# Patient Record
Sex: Female | Born: 1985 | Race: White | Hispanic: No | Marital: Married | Smoking: Never smoker
Health system: Southern US, Community
[De-identification: ages and names within clinical notes are randomized; demographics above are authoritative.]

## PROBLEM LIST (undated history)

## (undated) ENCOUNTER — Inpatient Hospital Stay (HOSPITAL_COMMUNITY): Payer: Self-pay

## (undated) DIAGNOSIS — Z789 Other specified health status: Secondary | ICD-10-CM

## (undated) DIAGNOSIS — Z3491 Encounter for supervision of normal pregnancy, unspecified, first trimester: Principal | ICD-10-CM

## (undated) DIAGNOSIS — F419 Anxiety disorder, unspecified: Secondary | ICD-10-CM

## (undated) DIAGNOSIS — Z3490 Encounter for supervision of normal pregnancy, unspecified, unspecified trimester: Secondary | ICD-10-CM

## (undated) DIAGNOSIS — F32A Depression, unspecified: Secondary | ICD-10-CM

## (undated) HISTORY — DX: Encounter for supervision of normal pregnancy, unspecified, unspecified trimester: Z34.90

## (undated) HISTORY — DX: Anxiety disorder, unspecified: F41.9

## (undated) HISTORY — DX: Encounter for supervision of normal pregnancy, unspecified, first trimester: Z34.91

## (undated) HISTORY — PX: CHOLECYSTECTOMY: SHX55

## (undated) HISTORY — DX: Depression, unspecified: F32.A

---
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
## 2009-03-20 ENCOUNTER — Emergency Department (HOSPITAL_COMMUNITY): Admission: EM | Admit: 2009-03-20 | Discharge: 2009-03-20 | Payer: Self-pay | Admitting: Emergency Medicine

## 2009-05-02 ENCOUNTER — Ambulatory Visit (HOSPITAL_COMMUNITY): Admission: RE | Admit: 2009-05-02 | Discharge: 2009-05-02 | Payer: Self-pay | Admitting: Pulmonary Disease

## 2009-08-16 ENCOUNTER — Emergency Department (HOSPITAL_COMMUNITY): Admission: EM | Admit: 2009-08-16 | Discharge: 2009-08-16 | Payer: Self-pay | Admitting: Emergency Medicine

## 2010-08-25 ENCOUNTER — Emergency Department (HOSPITAL_COMMUNITY)
Admission: EM | Admit: 2010-08-25 | Discharge: 2010-08-26 | Payer: Self-pay | Source: Home / Self Care | Admitting: Emergency Medicine

## 2010-09-12 ENCOUNTER — Ambulatory Visit (HOSPITAL_COMMUNITY)
Admission: RE | Admit: 2010-09-12 | Discharge: 2010-09-12 | Payer: Self-pay | Source: Home / Self Care | Attending: General Surgery | Admitting: General Surgery

## 2010-11-24 LAB — SURGICAL PCR SCREEN: MRSA, PCR: NEGATIVE

## 2010-11-25 LAB — DIFFERENTIAL
Basophils Absolute: 0 10*3/uL (ref 0.0–0.1)
Basophils Relative: 0 % (ref 0–1)
Eosinophils Absolute: 0.1 10*3/uL (ref 0.0–0.7)
Eosinophils Relative: 1 % (ref 0–5)
Lymphocytes Relative: 15 % (ref 12–46)
Monocytes Absolute: 0.5 10*3/uL (ref 0.1–1.0)

## 2010-11-25 LAB — COMPREHENSIVE METABOLIC PANEL
AST: 134 U/L — ABNORMAL HIGH (ref 0–37)
Albumin: 4.3 g/dL (ref 3.5–5.2)
Alkaline Phosphatase: 98 U/L (ref 39–117)
CO2: 24 mEq/L (ref 19–32)
Chloride: 102 mEq/L (ref 96–112)
Creatinine, Ser: 0.59 mg/dL (ref 0.4–1.2)
GFR calc Af Amer: >60 mL/min (ref >60)
GFR calc non Af Amer: >60 mL/min (ref >60)
Potassium: 3.6 mEq/L (ref 3.5–5.1)
Total Bilirubin: 0.8 mg/dL (ref 0.3–1.2)

## 2010-11-25 LAB — CBC
Hemoglobin: 13.9 g/dL (ref 12.0–15.0)
MCH: 28.5 pg (ref 26.0–34.0)
MCV: 79.5 fL (ref 78.0–100.0)
Platelets: 182 10*3/uL (ref 150–400)
RBC: 4.87 MIL/uL (ref 3.87–5.11)
WBC: 11.1 10*3/uL — ABNORMAL HIGH (ref 4.0–10.5)

## 2010-11-25 LAB — LIPASE, BLOOD: Lipase: 35 U/L (ref 11–59)

## 2010-11-25 LAB — URINALYSIS, ROUTINE W REFLEX MICROSCOPIC
Bilirubin Urine: NEGATIVE
Glucose, UA: NEGATIVE mg/dL
Hgb urine dipstick: NEGATIVE
Ketones, ur: NEGATIVE mg/dL
Protein, ur: NEGATIVE mg/dL
Urobilinogen, UA: 0.2 mg/dL (ref 0.0–1.0)

## 2010-12-16 LAB — URINALYSIS, ROUTINE W REFLEX MICROSCOPIC
Leukocytes, UA: NEGATIVE
Protein, ur: NEGATIVE mg/dL
Specific Gravity, Urine: >1.03 — ABNORMAL HIGH (ref 1.005–1.030)
Urobilinogen, UA: 0.2 mg/dL (ref 0.0–1.0)

## 2010-12-16 LAB — COMPREHENSIVE METABOLIC PANEL
Albumin: 3.9 g/dL (ref 3.5–5.2)
BUN: 6 mg/dL (ref 6–23)
Calcium: 8.7 mg/dL (ref 8.4–10.5)
Chloride: 105 mEq/L (ref 96–112)
Creatinine, Ser: 0.57 mg/dL (ref 0.4–1.2)
GFR calc Af Amer: >60 mL/min (ref >60)
GFR calc non Af Amer: >60 mL/min (ref >60)
Total Bilirubin: 0.5 mg/dL (ref 0.3–1.2)

## 2010-12-16 LAB — DIFFERENTIAL
Basophils Absolute: 0 10*3/uL (ref 0.0–0.1)
Lymphocytes Relative: 46 % (ref 12–46)
Lymphs Abs: 2.4 10*3/uL (ref 0.7–4.0)
Monocytes Absolute: 0.3 10*3/uL (ref 0.1–1.0)
Neutro Abs: 2.3 10*3/uL (ref 1.7–7.7)

## 2010-12-16 LAB — CBC
HCT: 40.5 % (ref 36.0–46.0)
MCHC: 34.3 g/dL (ref 30.0–36.0)
MCV: 83.3 fL (ref 78.0–100.0)
Platelets: 157 10*3/uL (ref 150–400)
WBC: 5.3 10*3/uL (ref 4.0–10.5)

## 2010-12-16 LAB — LIPASE, BLOOD: Lipase: 28 U/L (ref 11–59)

## 2010-12-16 LAB — URINE MICROSCOPIC-ADD ON

## 2011-06-28 ENCOUNTER — Encounter: Payer: Self-pay | Admitting: *Deleted

## 2011-06-28 DIAGNOSIS — R209 Unspecified disturbances of skin sensation: Secondary | ICD-10-CM | POA: Insufficient documentation
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
## 2011-06-29 ENCOUNTER — Emergency Department (HOSPITAL_COMMUNITY)
Admission: EM | Admit: 2011-06-29 | Discharge: 2011-06-29 | Disposition: A | Discharge status: Home / Self Care | Payer: 59 | Attending: Emergency Medicine | Admitting: Emergency Medicine
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
## 2011-06-29 NOTE — ED Notes (Signed)
Pt left the er stating no needs 
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
## 2012-10-06 ENCOUNTER — Other Ambulatory Visit (HOSPITAL_COMMUNITY)
Admission: RE | Admit: 2012-10-06 | Discharge: 2012-10-06 | Disposition: A | Discharge status: Home / Self Care | Payer: 59 | Source: Ambulatory Visit | Attending: Obstetrics and Gynecology | Admitting: Obstetrics and Gynecology

## 2012-10-06 ENCOUNTER — Other Ambulatory Visit: Payer: Self-pay | Admitting: Obstetrics and Gynecology

## 2012-10-06 DIAGNOSIS — Z01419 Encounter for gynecological examination (general) (routine) without abnormal findings: Secondary | ICD-10-CM | POA: Insufficient documentation

## 2014-06-19 ENCOUNTER — Encounter: Payer: Self-pay | Admitting: Adult Health

## 2014-06-19 ENCOUNTER — Ambulatory Visit (INDEPENDENT_AMBULATORY_CARE_PROVIDER_SITE_OTHER): Payer: 59 | Admitting: Adult Health

## 2014-06-19 VITALS — BP 148/76 | Ht 61.0 in | Wt 188.5 lb

## 2014-06-19 DIAGNOSIS — Z3201 Encounter for pregnancy test, result positive: Secondary | ICD-10-CM

## 2014-06-19 DIAGNOSIS — Z3490 Encounter for supervision of normal pregnancy, unspecified, unspecified trimester: Secondary | ICD-10-CM

## 2014-06-19 HISTORY — DX: Encounter for supervision of normal pregnancy, unspecified, unspecified trimester: Z34.90

## 2014-06-19 LAB — POCT URINE PREGNANCY: PREG TEST UR: POSITIVE

## 2014-06-19 NOTE — Progress Notes (Signed)
Subjective:     Patient ID: Tiffany Bass, female   DOB: 06/03/1986, 28 y.o.   MRN: [id number]  HPI Tiffany Bass is a 28 year old white female, married in for UPT.  Review of Systems See HPI Reviewed past medical,surgical, social and family history. Reviewed medications and allergies.     Objective:   Physical Exam BP 148/76  Ht 5\' 1"  (1.549 m)  Wt 188 lb 8 oz (85.503 kg)  BMI 35.64 kg/m2  LMP 05/07/2014   +UPT about 6+1 week by LMP EDD 02/12/15. Some queasy feelings no nausea or vomiting.Eating crackers, works at [employer], no lifting over 20 lbs.  Assessment:     +UPT Pregnant     Plan:    Continue flintstones  Return in 10 days for US and new OB with me   Review handout on first trimester

## 2014-06-19 NOTE — Patient Instructions (Signed)
First Trimester of Pregnancy The first trimester of pregnancy is from week 1 until the end of week 12 (months 1 through 3). A week after a sperm fertilizes an egg, the egg will implant on the wall of the uterus. This embryo will begin to develop into a baby. Genes from you and your partner are forming the baby. The female genes determine whether the baby is a boy or a girl. At 6-8 weeks, the eyes and face are formed, and the heartbeat can be seen on ultrasound. At the end of 12 weeks, all the baby's organs are formed.  Now that you are pregnant, you will want to do everything you can to have a healthy baby. Two of the most important things are to get good prenatal care and to follow your health care provider's instructions. Prenatal care is all the medical care you receive before the baby's birth. This care will help prevent, find, and treat any problems during the pregnancy and childbirth. BODY CHANGES Your body goes through many changes during pregnancy. The changes vary from woman to woman.   You may gain or lose a couple of pounds at first.  You may feel sick to your stomach (nauseous) and throw up (vomit). If the vomiting is uncontrollable, call your health care provider.  You may tire easily.  You may develop headaches that can be relieved by medicines approved by your health care provider.  You may urinate more often. Painful urination may mean you have a bladder infection.  You may develop heartburn as a result of your pregnancy.  You may develop constipation because certain hormones are causing the muscles that push waste through your intestines to slow down.  You may develop hemorrhoids or swollen, bulging veins (varicose veins).  Your breasts may begin to grow larger and become tender. Your nipples may stick out more, and the tissue that surrounds them (areola) may become darker.  Your gums may bleed and may be sensitive to brushing and flossing.  Dark spots or blotches (chloasma,  mask of pregnancy) may develop on your face. This will likely fade after the baby is born.  Your menstrual periods will stop.  You may have a loss of appetite.  You may develop cravings for certain kinds of food.  You may have changes in your emotions from day to day, such as being excited to be pregnant or being concerned that something may go wrong with the pregnancy and baby.  You may have more vivid and strange dreams.  You may have changes in your hair. These can include thickening of your hair, rapid growth, and changes in texture. Some women also have hair loss during or after pregnancy, or hair that feels dry or thin. Your hair will most likely return to normal after your baby is born. WHAT TO EXPECT AT YOUR PRENATAL VISITS During a routine prenatal visit:  You will be weighed to make sure you and the baby are growing normally.  Your blood pressure will be taken.  Your abdomen will be measured to track your baby's growth.  The fetal heartbeat will be listened to starting around week 10 or 12 of your pregnancy.  Test results from any previous visits will be discussed. Your health care provider may ask you:  How you are feeling.  If you are feeling the baby move.  If you have had any abnormal symptoms, such as leaking fluid, bleeding, severe headaches, or abdominal cramping.  If you have any questions. Other tests   that may be performed during your first trimester include:  Blood tests to find your blood type and to check for the presence of any previous infections. They will also be used to check for low iron levels (anemia) and Rh antibodies. Later in the pregnancy, blood tests for diabetes will be done along with other tests if problems develop.  Urine tests to check for infections, diabetes, or protein in the urine.  An ultrasound to confirm the proper growth and development of the baby.  An amniocentesis to check for possible genetic problems.  Fetal screens for  spina bifida and Down syndrome.  You may need other tests to make sure you and the baby are doing well. HOME CARE INSTRUCTIONS  Medicines  Follow your health care provider's instructions regarding medicine use. Specific medicines may be either safe or unsafe to take during pregnancy.  Take your prenatal vitamins as directed.  If you develop constipation, try taking a stool softener if your health care provider approves. Diet  Eat regular, well-balanced meals. Choose a variety of foods, such as meat or vegetable-based protein, fish, milk and low-fat dairy products, vegetables, fruits, and whole grain breads and cereals. Your health care provider will help you determine the amount of weight gain that is right for you.  Avoid raw meat and uncooked cheese. These carry germs that can cause birth defects in the baby.  Eating four or five small meals rather than three large meals a day may help relieve nausea and vomiting. If you start to feel nauseous, eating a few soda crackers can be helpful. Drinking liquids between meals instead of during meals also seems to help nausea and vomiting.  If you develop constipation, eat more high-fiber foods, such as fresh vegetables or fruit and whole grains. Drink enough fluids to keep your urine clear or pale yellow. Activity and Exercise  Exercise only as directed by your health care provider. Exercising will help you:  Control your weight.  Stay in shape.  Be prepared for labor and delivery.  Experiencing pain or cramping in the lower abdomen or low back is a good sign that you should stop exercising. Check with your health care provider before continuing normal exercises.  Try to avoid standing for long periods of time. Move your legs often if you must stand in one place for a long time.  Avoid heavy lifting.  Wear low-heeled shoes, and practice good posture.  You may continue to have sex unless your health care provider directs you  otherwise. Relief of Pain or Discomfort  Wear a good support bra for breast tenderness.   Take warm sitz baths to soothe any pain or discomfort caused by hemorrhoids. Use hemorrhoid cream if your health care provider approves.   Rest with your legs elevated if you have leg cramps or low back pain.  If you develop varicose veins in your legs, wear support hose. Elevate your feet for 15 minutes, 3-4 times a day. Limit salt in your diet. Prenatal Care  Schedule your prenatal visits by the twelfth week of pregnancy. They are usually scheduled monthly at first, then more often in the last 2 months before delivery.  Write down your questions. Take them to your prenatal visits.  Keep all your prenatal visits as directed by your health care provider. Safety  Wear your seat belt at all times when driving.  Make a list of emergency phone numbers, including numbers for family, friends, the hospital, and police and fire departments. General Tips    Ask your health care provider for a referral to a local prenatal education class. Begin classes no later than at the beginning of month 6 of your pregnancy.  Ask for help if you have counseling or nutritional needs during pregnancy. Your health care provider can offer advice or refer you to specialists for help with various needs.  Do not use hot tubs, steam rooms, or saunas.  Do not douche or use tampons or scented sanitary pads.  Do not cross your legs for long periods of time.  Avoid cat litter boxes and soil used by cats. These carry germs that can cause birth defects in the baby and possibly loss of the fetus by miscarriage or stillbirth.  Avoid all smoking, herbs, alcohol, and medicines not prescribed by your health care provider. Chemicals in these affect the formation and growth of the baby.  Schedule a dentist appointment. At home, brush your teeth with a soft toothbrush and be gentle when you floss. SEEK MEDICAL CARE IF:   You have  dizziness.  You have mild pelvic cramps, pelvic pressure, or nagging pain in the abdominal area.  You have persistent nausea, vomiting, or diarrhea.  You have a bad smelling vaginal discharge.  You have pain with urination.  You notice increased swelling in your face, hands, legs, or ankles. SEEK IMMEDIATE MEDICAL CARE IF:   You have a fever.  You are leaking fluid from your vagina.  You have spotting or bleeding from your vagina.  You have severe abdominal cramping or pain.  You have rapid weight gain or loss.  You vomit blood or material that looks like coffee grounds.  You are exposed to [place] and have never had them.  You are exposed to fifth disease or chickenpox.  You develop a severe headache.  You have shortness of breath.  You have any kind of trauma, such as from a fall or a car accident. Document Released: 08/25/2001 Document Revised: 01/15/2014 Document Reviewed: 07/11/2013 [facility] Patient Information 2015 [employer] [place]. This information is not intended to replace advice given to you by your health care provider. Make sure you discuss any questions you have with your health care provider. Follow up in 10 days for US and new OB with me

## 2014-06-29 ENCOUNTER — Encounter: Payer: Self-pay | Admitting: Adult Health

## 2014-06-29 ENCOUNTER — Other Ambulatory Visit: Payer: Self-pay | Admitting: Adult Health

## 2014-06-29 ENCOUNTER — Ambulatory Visit (INDEPENDENT_AMBULATORY_CARE_PROVIDER_SITE_OTHER): Payer: 59 | Admitting: Adult Health

## 2014-06-29 ENCOUNTER — Ambulatory Visit (INDEPENDENT_AMBULATORY_CARE_PROVIDER_SITE_OTHER): Payer: 59

## 2014-06-29 VITALS — BP 140/70 | Wt 190.0 lb

## 2014-06-29 DIAGNOSIS — Z1159 Encounter for screening for other viral diseases: Secondary | ICD-10-CM

## 2014-06-29 DIAGNOSIS — Z3201 Encounter for pregnancy test, result positive: Secondary | ICD-10-CM

## 2014-06-29 DIAGNOSIS — Z1389 Encounter for screening for other disorder: Secondary | ICD-10-CM

## 2014-06-29 DIAGNOSIS — Z331 Pregnant state, incidental: Secondary | ICD-10-CM

## 2014-06-29 DIAGNOSIS — Z3491 Encounter for supervision of normal pregnancy, unspecified, first trimester: Secondary | ICD-10-CM

## 2014-06-29 DIAGNOSIS — Z1371 Encounter for nonprocreative screening for genetic disease carrier status: Secondary | ICD-10-CM

## 2014-06-29 DIAGNOSIS — O26841 Uterine size-date discrepancy, first trimester: Secondary | ICD-10-CM

## 2014-06-29 DIAGNOSIS — Z3490 Encounter for supervision of normal pregnancy, unspecified, unspecified trimester: Secondary | ICD-10-CM

## 2014-06-29 DIAGNOSIS — Z0283 Encounter for blood-alcohol and blood-drug test: Secondary | ICD-10-CM

## 2014-06-29 DIAGNOSIS — Z3481 Encounter for supervision of other normal pregnancy, first trimester: Secondary | ICD-10-CM

## 2014-06-29 DIAGNOSIS — Z113 Encounter for screening for infections with a predominantly sexual mode of transmission: Secondary | ICD-10-CM

## 2014-06-29 DIAGNOSIS — Z114 Encounter for screening for human immunodeficiency virus [HIV]: Secondary | ICD-10-CM

## 2014-06-29 DIAGNOSIS — Z3401 Encounter for supervision of normal first pregnancy, first trimester: Secondary | ICD-10-CM

## 2014-06-29 DIAGNOSIS — Z3400 Encounter for supervision of normal first pregnancy, unspecified trimester: Secondary | ICD-10-CM | POA: Insufficient documentation

## 2014-06-29 DIAGNOSIS — Z118 Encounter for screening for other infectious and parasitic diseases: Secondary | ICD-10-CM

## 2014-06-29 DIAGNOSIS — Z0184 Encounter for antibody response examination: Secondary | ICD-10-CM

## 2014-06-29 DIAGNOSIS — Z1329 Encounter for screening for other suspected endocrine disorder: Secondary | ICD-10-CM

## 2014-06-29 HISTORY — DX: Encounter for supervision of normal pregnancy, unspecified, first trimester: Z34.91

## 2014-06-29 LAB — POCT URINALYSIS DIPSTICK
Blood, UA: NEGATIVE
Glucose, UA: NEGATIVE
Ketones, UA: NEGATIVE
LEUKOCYTES UA: NEGATIVE
Nitrite, UA: NEGATIVE
PROTEIN UA: NEGATIVE

## 2014-06-29 LAB — CBC
HCT: 40.2 % (ref 36.0–46.0)
Hemoglobin: 14.2 g/dL (ref 12.0–15.0)
MCH: 28.8 pg (ref 26.0–34.0)
MCHC: 35.3 g/dL (ref 30.0–36.0)
MCV: 81.5 fL (ref 78.0–100.0)
PLATELETS: 185 10*3/uL (ref 150–400)
RBC: 4.93 MIL/uL (ref 3.87–5.11)
RDW: 14.1 % (ref 11.5–15.5)
WBC: 7.8 10*3/uL (ref 4.0–10.5)

## 2014-06-29 NOTE — Progress Notes (Signed)
U/S-transvaginal u/s performed, retroverted uterus noted with single IUP with +FCA noted FHR-118 bpm, CRL c/w 6+1wks EDD 02/21/2015, cx appears closed, bilateral adnexa appears WNL with C.L. noted

## 2014-06-29 NOTE — Patient Instructions (Signed)
First Trimester of Pregnancy The first trimester of pregnancy is from week 1 until the end of week 12 (months 1 through 3). A week after a sperm fertilizes an egg, the egg will implant on the wall of the uterus. This embryo will begin to develop into a baby. Genes from you and your partner are forming the baby. The female genes determine whether the baby is a boy or a girl. At 6-8 weeks, the eyes and face are formed, and the heartbeat can be seen on ultrasound. At the end of 12 weeks, all the baby's organs are formed.  Now that you are pregnant, you will want to do everything you can to have a healthy baby. Two of the most important things are to get good prenatal care and to follow your health care provider's instructions. Prenatal care is all the medical care you receive before the baby's birth. This care will help prevent, find, and treat any problems during the pregnancy and childbirth. BODY CHANGES Your body goes through many changes during pregnancy. The changes vary from woman to woman.   You may gain or lose a couple of pounds at first.  You may feel sick to your stomach (nauseous) and throw up (vomit). If the vomiting is uncontrollable, call your health care provider.  You may tire easily.  You may develop headaches that can be relieved by medicines approved by your health care provider.  You may urinate more often. Painful urination may mean you have a bladder infection.  You may develop heartburn as a result of your pregnancy.  You may develop constipation because certain hormones are causing the muscles that push waste through your intestines to slow down.  You may develop hemorrhoids or swollen, bulging veins (varicose veins).  Your breasts may begin to grow larger and become tender. Your nipples may stick out more, and the tissue that surrounds them (areola) may become darker.  Your gums may bleed and may be sensitive to brushing and flossing.  Dark spots or blotches (chloasma,  mask of pregnancy) may develop on your face. This will likely fade after the baby is born.  Your menstrual periods will stop.  You may have a loss of appetite.  You may develop cravings for certain kinds of food.  You may have changes in your emotions from day to day, such as being excited to be pregnant or being concerned that something may go wrong with the pregnancy and baby.  You may have more vivid and strange dreams.  You may have changes in your hair. These can include thickening of your hair, rapid growth, and changes in texture. Some women also have hair loss during or after pregnancy, or hair that feels dry or thin. Your hair will most likely return to normal after your baby is born. WHAT TO EXPECT AT YOUR PRENATAL VISITS During a routine prenatal visit:  You will be weighed to make sure you and the baby are growing normally.  Your blood pressure will be taken.  Your abdomen will be measured to track your baby's growth.  The fetal heartbeat will be listened to starting around week 10 or 12 of your pregnancy.  Test results from any previous visits will be discussed. Your health care provider may ask you:  How you are feeling.  If you are feeling the baby move.  If you have had any abnormal symptoms, such as leaking fluid, bleeding, severe headaches, or abdominal cramping.  If you have any questions. Other tests   that may be performed during your first trimester include:  Blood tests to find your blood type and to check for the presence of any previous infections. They will also be used to check for low iron levels (anemia) and Rh antibodies. Later in the pregnancy, blood tests for diabetes will be done along with other tests if problems develop.  Urine tests to check for infections, diabetes, or protein in the urine.  An ultrasound to confirm the proper growth and development of the baby.  An amniocentesis to check for possible genetic problems.  Fetal screens for  spina bifida and Down syndrome.  You may need other tests to make sure you and the baby are doing well. HOME CARE INSTRUCTIONS  Medicines  Follow your health care provider's instructions regarding medicine use. Specific medicines may be either safe or unsafe to take during pregnancy.  Take your prenatal vitamins as directed.  If you develop constipation, try taking a stool softener if your health care provider approves. Diet  Eat regular, well-balanced meals. Choose a variety of foods, such as meat or vegetable-based protein, fish, milk and low-fat dairy products, vegetables, fruits, and whole grain breads and cereals. Your health care provider will help you determine the amount of weight gain that is right for you.  Avoid raw meat and uncooked cheese. These carry germs that can cause birth defects in the baby.  Eating four or five small meals rather than three large meals a day may help relieve nausea and vomiting. If you start to feel nauseous, eating a few soda crackers can be helpful. Drinking liquids between meals instead of during meals also seems to help nausea and vomiting.  If you develop constipation, eat more high-fiber foods, such as fresh vegetables or fruit and whole grains. Drink enough fluids to keep your urine clear or pale yellow. Activity and Exercise  Exercise only as directed by your health care provider. Exercising will help you:  Control your weight.  Stay in shape.  Be prepared for labor and delivery.  Experiencing pain or cramping in the lower abdomen or low back is a good sign that you should stop exercising. Check with your health care provider before continuing normal exercises.  Try to avoid standing for long periods of time. Move your legs often if you must stand in one place for a long time.  Avoid heavy lifting.  Wear low-heeled shoes, and practice good posture.  You may continue to have sex unless your health care provider directs you  otherwise. Relief of Pain or Discomfort  Wear a good support bra for breast tenderness.   Take warm sitz baths to soothe any pain or discomfort caused by hemorrhoids. Use hemorrhoid cream if your health care provider approves.   Rest with your legs elevated if you have leg cramps or low back pain.  If you develop varicose veins in your legs, wear support hose. Elevate your feet for 15 minutes, 3-4 times a day. Limit salt in your diet. Prenatal Care  Schedule your prenatal visits by the twelfth week of pregnancy. They are usually scheduled monthly at first, then more often in the last 2 months before delivery.  Write down your questions. Take them to your prenatal visits.  Keep all your prenatal visits as directed by your health care provider. Safety  Wear your seat belt at all times when driving.  Make a list of emergency phone numbers, including numbers for family, friends, the hospital, and police and fire departments. General Tips    Ask your health care provider for a referral to a local prenatal education class. Begin classes no later than at the beginning of month 6 of your pregnancy.  Ask for help if you have counseling or nutritional needs during pregnancy. Your health care provider can offer advice or refer you to specialists for help with various needs.  Do not use hot tubs, steam rooms, or saunas.  Do not douche or use tampons or scented sanitary pads.  Do not cross your legs for long periods of time.  Avoid cat litter boxes and soil used by cats. These carry germs that can cause birth defects in the baby and possibly loss of the fetus by miscarriage or stillbirth.  Avoid all smoking, herbs, alcohol, and medicines not prescribed by your health care provider. Chemicals in these affect the formation and growth of the baby.  Schedule a dentist appointment. At home, brush your teeth with a soft toothbrush and be gentle when you floss. SEEK MEDICAL CARE IF:   You have  dizziness.  You have mild pelvic cramps, pelvic pressure, or nagging pain in the abdominal area.  You have persistent nausea, vomiting, or diarrhea.  You have a bad smelling vaginal discharge.  You have pain with urination.  You notice increased swelling in your face, hands, legs, or ankles. SEEK IMMEDIATE MEDICAL CARE IF:   You have a fever.  You are leaking fluid from your vagina.  You have spotting or bleeding from your vagina.  You have severe abdominal cramping or pain.  You have rapid weight gain or loss.  You vomit blood or material that looks like coffee grounds.  You are exposed to [place] and have never had them.  You are exposed to fifth disease or chickenpox.  You develop a severe headache.  You have shortness of breath.  You have any kind of trauma, such as from a fall or a car accident. Document Released: 08/25/2001 Document Revised: 01/15/2014 Document Reviewed: 07/11/2013 [facility] Patient Information 2015 [place], [place]. This information is not intended to replace advice given to you by your health care provider. Make sure you discuss any questions you have with your health care provider. Follow up in 4 weeks

## 2014-06-29 NOTE — Progress Notes (Signed)
Subjective:  Tiffany Bass is a 28 y.o. G1P0 Caucasian female at 5667w1d by US being seen today for her first obstetrical visit.  Her obstetrical history is significant for none other than first pregnancy.  Pregnancy history fully reviewed.  Patient reports nausea.Declines meds for now ,try eating often,can use peppermint in defusor. Denies vb, cramping, uti s/s, abnormal/malodorous vag d/c, or vulvovaginal itching/irritation.  BP 140/70  LMP 05/07/2014  HISTORY: OB History  Gravida Para Term Preterm AB SAB TAB Ectopic Multiple Living  1             # Outcome Date GA Lbr Len/2nd Weight Sex Delivery Anes PTL Lv  1 CUR              Past Medical History  Diagnosis Date  . Pregnant 06/19/2014  . Supervision of normal pregnancy in first trimester 06/29/2014     [facility]  Roxana HiresChris  Nicolls 28 yo wm 1st Dating By US Pap 10/06/12 GC/CT Initial:                36+wks: Genetic Screen NT/IT:  CF screen  Anatomic US  Flu vaccine  Tdap Recommended ~ 28wks Glucose Screen  2 hr GBS  Feed Preference  Contraception  Circumcision  Childbirth Classes  Pediatrician     Past Surgical History  Procedure Laterality Date  . Cholecystectomy     Family History  Problem Relation Age of Onset  . Diabetes Paternal Grandmother   . COPD Father   . Asthma Father   . Hypertension Mother     Exam   System:     General: Well developed & nourished, no acute distress   Skin: Warm & dry, normal coloration and turgor, no rashes   Neurologic: Alert & oriented, normal mood   Cardiovascular: Regular rate & rhythm   Respiratory: Effort & rate normal, LCTAB, acyanotic   Abdomen: Soft, non tender   Extremities: normal strength, tone                                   Thyroid normal  Pelvic Exam:    Perineum: deferred   Vulva: deferred   Vagina:  deferred   Cervix: deferred   Uterus: deferred    FHR: 118 via US   Assessment:   Pregnancy: G1P0 Patient Active Problem List   Diagnosis Date Noted   . Supervision of normal pregnancy in first trimester 06/29/2014  . Pregnant 06/19/2014    5667w1d G1P0 New OB visit     Plan:  Initial labs drawn Continue prenatal vitamins Problem list reviewed and updated Reviewed n/v relief measures and warning s/s to report Reviewed recommended weight gain based on pre-gravid BMI Encouraged well-balanced diet Genetic Screening discussed Integrated Screen: declined Cystic fibrosis screening discussed requested Ultrasound discussed; fetal survey: requested Follow up in 4 weeks for US and see me Ask about flu shot at follow up to get after first trimester.  Adline PotterJennifer A. Griffin, NP 06/29/2014 11:21 AM

## 2014-06-30 LAB — URINALYSIS, ROUTINE W REFLEX MICROSCOPIC
Bilirubin Urine: NEGATIVE
GLUCOSE, UA: NEGATIVE mg/dL
HGB URINE DIPSTICK: NEGATIVE
KETONES UR: NEGATIVE mg/dL
Leukocytes, UA: NEGATIVE
Nitrite: NEGATIVE
Protein, ur: NEGATIVE mg/dL
Specific Gravity, Urine: 1.026 (ref 1.005–1.030)
Urobilinogen, UA: 0.2 mg/dL (ref 0.0–1.0)
pH: 6 (ref 5.0–8.0)

## 2014-06-30 LAB — TSH: TSH: 2.762 u[IU]/mL (ref 0.350–4.500)

## 2014-06-30 LAB — RPR

## 2014-06-30 LAB — URINE CULTURE
Colony Count: NO GROWTH
ORGANISM ID, BACTERIA: NO GROWTH

## 2014-06-30 LAB — DRUG SCREEN, URINE, NO CONFIRMATION
Amphetamine Screen, Ur: NEGATIVE
BARBITURATE QUANT UR: NEGATIVE
Benzodiazepines.: NEGATIVE
Cocaine Metabolites: NEGATIVE
Creatinine,U: 178.3 mg/dL
MARIJUANA METABOLITE: NEGATIVE
METHADONE: NEGATIVE
OPIATE SCREEN, URINE: NEGATIVE
PROPOXYPHENE: NEGATIVE
Phencyclidine (PCP): NEGATIVE

## 2014-06-30 LAB — ANTIBODY SCREEN: Antibody Screen: NEGATIVE

## 2014-06-30 LAB — GC/CHLAMYDIA PROBE AMP
CT Probe RNA: NEGATIVE
GC Probe RNA: NEGATIVE

## 2014-06-30 LAB — ABO AND RH: Rh Type: POSITIVE

## 2014-06-30 LAB — HIV ANTIBODY (ROUTINE TESTING W REFLEX): HIV: NONREACTIVE

## 2014-06-30 LAB — RUBELLA SCREEN: Rubella: 1.26 Index — ABNORMAL HIGH (ref <0.90)

## 2014-06-30 LAB — OXYCODONE SCREEN, UA, RFLX CONFIRM: OXYCODONE SCRN UR: NEGATIVE ng/mL

## 2014-06-30 LAB — HEPATITIS B SURFACE ANTIGEN: Hepatitis B Surface Ag: NEGATIVE

## 2014-06-30 LAB — VARICELLA ZOSTER ANTIBODY, IGG: Varicella IgG: 2451 Index — ABNORMAL HIGH (ref <135.00)

## 2014-07-02 ENCOUNTER — Encounter: Payer: Self-pay | Admitting: Adult Health

## 2014-07-02 LAB — CYSTIC FIBROSIS DIAGNOSTIC STUDY

## 2014-07-03 ENCOUNTER — Encounter: Payer: Self-pay | Admitting: Adult Health

## 2014-07-16 ENCOUNTER — Encounter: Payer: Self-pay | Admitting: Adult Health

## 2014-07-27 ENCOUNTER — Ambulatory Visit (INDEPENDENT_AMBULATORY_CARE_PROVIDER_SITE_OTHER): Payer: 59 | Admitting: Adult Health

## 2014-07-27 ENCOUNTER — Encounter: Payer: Self-pay | Admitting: Adult Health

## 2014-07-27 ENCOUNTER — Ambulatory Visit (INDEPENDENT_AMBULATORY_CARE_PROVIDER_SITE_OTHER): Payer: 59

## 2014-07-27 VITALS — BP 136/70 | Wt 190.0 lb

## 2014-07-27 DIAGNOSIS — Z3481 Encounter for supervision of other normal pregnancy, first trimester: Secondary | ICD-10-CM

## 2014-07-27 DIAGNOSIS — O3680X0 Pregnancy with inconclusive fetal viability, not applicable or unspecified: Secondary | ICD-10-CM

## 2014-07-27 DIAGNOSIS — Z1389 Encounter for screening for other disorder: Secondary | ICD-10-CM

## 2014-07-27 DIAGNOSIS — Z3491 Encounter for supervision of normal pregnancy, unspecified, first trimester: Secondary | ICD-10-CM

## 2014-07-27 DIAGNOSIS — Z331 Pregnant state, incidental: Secondary | ICD-10-CM

## 2014-07-27 LAB — POCT URINALYSIS DIPSTICK
Glucose, UA: NEGATIVE
Ketones, UA: NEGATIVE
Leukocytes, UA: NEGATIVE
Nitrite, UA: NEGATIVE
Protein, UA: NEGATIVE

## 2014-07-27 NOTE — Progress Notes (Signed)
G1P0 7831w1d Estimated Date of Delivery: 02/21/15  Blood pressure 136/70, weight 190 lb (86.183 kg), last menstrual period 05/07/2014.   BP weight and urine results all reviewed and noted.  Please refer to the obstetrical flow sheet for the fundal height and fetal heart rate documentation:FHR 175 US  Patient denies any bleeding and no rupture of membranes symptoms or regular contractions. Patient is without complaints.Nausea much better. All questions were answered.  Plan:  Continued routine obstetrical care,  Follow up in 4 weeks for OB appointment, with Selena BattenKim

## 2014-07-27 NOTE — Progress Notes (Signed)
U/S(10+1wks)-active fetus CRL c/w dates, fluid WNL, fundal Gr 0 placenta, cx appears closed, FHr- 175 bpm, bilateral adnexa appears WNL

## 2014-07-27 NOTE — Patient Instructions (Signed)
Second Trimester of Pregnancy The second trimester is from week 13 through week 28, months 4 through 6. The second trimester is often a time when you feel your best. Your body has also adjusted to being pregnant, and you begin to feel better physically. Usually, morning sickness has lessened or quit completely, you may have more energy, and you may have an increase in appetite. The second trimester is also a time when the fetus is growing rapidly. At the end of the sixth month, the fetus is about 9 inches long and weighs about 1 pounds. You will likely begin to feel the baby move (quickening) between 18 and 20 weeks of the pregnancy. BODY CHANGES Your body goes through many changes during pregnancy. The changes vary from woman to woman.   Your weight will continue to increase. You will notice your lower abdomen bulging out.  You may begin to get stretch marks on your hips, abdomen, and breasts.  You may develop headaches that can be relieved by medicines approved by your health care provider.  You may urinate more often because the fetus is pressing on your bladder.  You may develop or continue to have heartburn as a result of your pregnancy.  You may develop constipation because certain hormones are causing the muscles that push waste through your intestines to slow down.  You may develop hemorrhoids or swollen, bulging veins (varicose veins).  You may have back pain because of the weight gain and pregnancy hormones relaxing your joints between the bones in your pelvis and as a result of a shift in weight and the muscles that support your balance.  Your breasts will continue to grow and be tender.  Your gums may bleed and may be sensitive to brushing and flossing.  Dark spots or blotches (chloasma, mask of pregnancy) may develop on your face. This will likely fade after the baby is born.  A dark line from your belly button to the pubic area (linea nigra) may appear. This will likely fade  after the baby is born.  You may have changes in your hair. These can include thickening of your hair, rapid growth, and changes in texture. Some women also have hair loss during or after pregnancy, or hair that feels dry or thin. Your hair will most likely return to normal after your baby is born. WHAT TO EXPECT AT YOUR PRENATAL VISITS During a routine prenatal visit:  You will be weighed to make sure you and the fetus are growing normally.  Your blood pressure will be taken.  Your abdomen will be measured to track your baby's growth.  The fetal heartbeat will be listened to.  Any test results from the previous visit will be discussed. Your health care provider may ask you:  How you are feeling.  If you are feeling the baby move.  If you have had any abnormal symptoms, such as leaking fluid, bleeding, severe headaches, or abdominal cramping.  If you have any questions. Other tests that may be performed during your second trimester include:  Blood tests that check for:  Low iron levels (anemia).  Gestational diabetes (between 24 and 28 weeks).  Rh antibodies.  Urine tests to check for infections, diabetes, or protein in the urine.  An ultrasound to confirm the proper growth and development of the baby.  An amniocentesis to check for possible genetic problems.  Fetal screens for spina bifida and Down syndrome. HOME CARE INSTRUCTIONS   Avoid all smoking, herbs, alcohol, and unprescribed   drugs. These chemicals affect the formation and growth of the baby.  Follow your health care provider's instructions regarding medicine use. There are medicines that are either safe or unsafe to take during pregnancy.  Exercise only as directed by your health care provider. Experiencing uterine cramps is a good sign to stop exercising.  Continue to eat regular, healthy meals.  Wear a good support bra for breast tenderness.  Do not use hot tubs, steam rooms, or saunas.  Wear your  seat belt at all times when driving.  Avoid raw meat, uncooked cheese, cat litter boxes, and soil used by cats. These carry germs that can cause birth defects in the baby.  Take your prenatal vitamins.  Try taking a stool softener (if your health care provider approves) if you develop constipation. Eat more high-fiber foods, such as fresh vegetables or fruit and whole grains. Drink plenty of fluids to keep your urine clear or pale yellow.  Take warm sitz baths to soothe any pain or discomfort caused by hemorrhoids. Use hemorrhoid cream if your health care provider approves.  If you develop varicose veins, wear support hose. Elevate your feet for 15 minutes, 3-4 times a day. Limit salt in your diet.  Avoid heavy lifting, wear low heel shoes, and practice good posture.  Rest with your legs elevated if you have leg cramps or low back pain.  Visit your dentist if you have not gone yet during your pregnancy. Use a soft toothbrush to brush your teeth and be gentle when you floss.  A sexual relationship may be continued unless your health care provider directs you otherwise.  Continue to go to all your prenatal visits as directed by your health care provider. SEEK MEDICAL CARE IF:   You have dizziness.  You have mild pelvic cramps, pelvic pressure, or nagging pain in the abdominal area.  You have persistent nausea, vomiting, or diarrhea.  You have a bad smelling vaginal discharge.  You have pain with urination. SEEK IMMEDIATE MEDICAL CARE IF:   You have a fever.  You are leaking fluid from your vagina.  You have spotting or bleeding from your vagina.  You have severe abdominal cramping or pain.  You have rapid weight gain or loss.  You have shortness of breath with chest pain.  You notice sudden or extreme swelling of your face, hands, ankles, feet, or legs.  You have not felt your baby move in over an hour.  You have severe headaches that do not go away with  medicine.  You have vision changes. Document Released: 08/25/2001 Document Revised: 09/05/2013 Document Reviewed: 11/01/2012 [employer] Patient Information 2015 [employer]. This information is not intended to replace advice given to you by your health care provider. Make sure you discuss any questions you have with your health care provider. Follow up in 4 weeks 

## 2014-08-27 ENCOUNTER — Encounter: Payer: Self-pay | Admitting: Women's Health

## 2014-08-27 ENCOUNTER — Ambulatory Visit (INDEPENDENT_AMBULATORY_CARE_PROVIDER_SITE_OTHER): Payer: 59 | Admitting: Women's Health

## 2014-08-27 VITALS — BP 130/78 | Wt 188.0 lb

## 2014-08-27 DIAGNOSIS — Z1389 Encounter for screening for other disorder: Secondary | ICD-10-CM

## 2014-08-27 DIAGNOSIS — Z23 Encounter for immunization: Secondary | ICD-10-CM

## 2014-08-27 DIAGNOSIS — Z3402 Encounter for supervision of normal first pregnancy, second trimester: Secondary | ICD-10-CM

## 2014-08-27 DIAGNOSIS — Z331 Pregnant state, incidental: Secondary | ICD-10-CM

## 2014-08-27 LAB — POCT URINALYSIS DIPSTICK
Blood, UA: NEGATIVE
GLUCOSE UA: NEGATIVE
Ketones, UA: NEGATIVE
Leukocytes, UA: NEGATIVE
Nitrite, UA: NEGATIVE
Protein, UA: NEGATIVE

## 2014-08-27 NOTE — Patient Instructions (Signed)
Second Trimester of Pregnancy The second trimester is from week 13 through week 28, months 4 through 6. The second trimester is often a time when you feel your best. Your body has also adjusted to being pregnant, and you begin to feel better physically. Usually, morning sickness has lessened or quit completely, you may have more energy, and you may have an increase in appetite. The second trimester is also a time when the fetus is growing rapidly. At the end of the sixth month, the fetus is about 9 inches long and weighs about 1 pounds. You will likely begin to feel the baby move (quickening) between 18 and 20 weeks of the pregnancy. BODY CHANGES Your body goes through many changes during pregnancy. The changes vary from woman to woman.   Your weight will continue to increase. You will notice your lower abdomen bulging out.  You may begin to get stretch marks on your hips, abdomen, and breasts.  You may develop headaches that can be relieved by medicines approved by your health care provider.  You may urinate more often because the fetus is pressing on your bladder.  You may develop or continue to have heartburn as a result of your pregnancy.  You may develop constipation because certain hormones are causing the muscles that push waste through your intestines to slow down.  You may develop hemorrhoids or swollen, bulging veins (varicose veins).  You may have back pain because of the weight gain and pregnancy hormones relaxing your joints between the bones in your pelvis and as a result of a shift in weight and the muscles that support your balance.  Your breasts will continue to grow and be tender.  Your gums may bleed and may be sensitive to brushing and flossing.  Dark spots or blotches (chloasma, mask of pregnancy) may develop on your face. This will likely fade after the baby is born.  A dark line from your belly button to the pubic area (linea nigra) may appear. This will likely fade  after the baby is born.  You may have changes in your hair. These can include thickening of your hair, rapid growth, and changes in texture. Some women also have hair loss during or after pregnancy, or hair that feels dry or thin. Your hair will most likely return to normal after your baby is born. WHAT TO EXPECT AT YOUR PRENATAL VISITS During a routine prenatal visit:  You will be weighed to make sure you and the fetus are growing normally.  Your blood pressure will be taken.  Your abdomen will be measured to track your baby's growth.  The fetal heartbeat will be listened to.  Any test results from the previous visit will be discussed. Your health care provider may ask you:  How you are feeling.  If you are feeling the baby move.  If you have had any abnormal symptoms, such as leaking fluid, bleeding, severe headaches, or abdominal cramping.  If you have any questions. Other tests that may be performed during your second trimester include:  Blood tests that check for:  Low iron levels (anemia).  Gestational diabetes (between 24 and 28 weeks).  Rh antibodies.  Urine tests to check for infections, diabetes, or protein in the urine.  An ultrasound to confirm the proper growth and development of the baby.  An amniocentesis to check for possible genetic problems.  Fetal screens for spina bifida and Down syndrome. HOME CARE INSTRUCTIONS   Avoid all smoking, herbs, alcohol, and unprescribed   drugs. These chemicals affect the formation and growth of the baby.  Follow your health care provider's instructions regarding medicine use. There are medicines that are either safe or unsafe to take during pregnancy.  Exercise only as directed by your health care provider. Experiencing uterine cramps is a good sign to stop exercising.  Continue to eat regular, healthy meals.  Wear a good support bra for breast tenderness.  Do not use hot tubs, steam rooms, or saunas.  Wear your  seat belt at all times when driving.  Avoid raw meat, uncooked cheese, cat litter boxes, and soil used by cats. These carry germs that can cause birth defects in the baby.  Take your prenatal vitamins.  Try taking a stool softener (if your health care provider approves) if you develop constipation. Eat more high-fiber foods, such as fresh vegetables or fruit and whole grains. Drink plenty of fluids to keep your urine clear or pale yellow.  Take warm sitz baths to soothe any pain or discomfort caused by hemorrhoids. Use hemorrhoid cream if your health care provider approves.  If you develop varicose veins, wear support hose. Elevate your feet for 15 minutes, 3-4 times a day. Limit salt in your diet.  Avoid heavy lifting, wear low heel shoes, and practice good posture.  Rest with your legs elevated if you have leg cramps or low back pain.  Visit your dentist if you have not gone yet during your pregnancy. Use a soft toothbrush to brush your teeth and be gentle when you floss.  A sexual relationship may be continued unless your health care provider directs you otherwise.  Continue to go to all your prenatal visits as directed by your health care provider. SEEK MEDICAL CARE IF:   You have dizziness.  You have mild pelvic cramps, pelvic pressure, or nagging pain in the abdominal area.  You have persistent nausea, vomiting, or diarrhea.  You have a bad smelling vaginal discharge.  You have pain with urination. SEEK IMMEDIATE MEDICAL CARE IF:   You have a fever.  You are leaking fluid from your vagina.  You have spotting or bleeding from your vagina.  You have severe abdominal cramping or pain.  You have rapid weight gain or loss.  You have shortness of breath with chest pain.  You notice sudden or extreme swelling of your face, hands, ankles, feet, or legs.  You have not felt your baby move in over an hour.  You have severe headaches that do not go away with  medicine.  You have vision changes. Document Released: 08/25/2001 Document Revised: 09/05/2013 Document Reviewed: 11/01/2012 [employer] Patient Information 2015 [employer]. This information is not intended to replace advice given to you by your health care provider. Make sure you discuss any questions you have with your health care provider.  

## 2014-08-27 NOTE — Progress Notes (Signed)
Low-risk OB appointment G1P0 5124w4d Estimated Date of Delivery: 02/21/15 BP 130/78 mmHg  Wt 188 lb (85.276 kg)  LMP 05/07/2014  BP, weight, and urine reviewed.  Refer to obstetrical flow sheet for FH & FHR.  No fm yet. Denies cramping, lof, vb, or uti s/s. No complaints. Declined genetic screeni ng.  Reviewed warning s/s to report. Plan:  Continue routine obstetrical care  F/U in 3wks for OB appointment, will wait til 20+wks for anatomy u/s

## 2014-09-14 NOTE — L&D Delivery Note (Signed)
28yo G1P0 at 7876w1d, induction for preeclampsia.  Delivery Note At 1:49 PM a viable female was delivered via  (Presentation: vertex, LOA).  APGAR: 8, 9; weight pending  .   Placenta status: delivered, intact spontaneously.  Cord: 3vc.  No complications.  Anesthesia: Epidural  Lacerations:  2nd degree Suture Repair: 3.0 vicryl rapide Est. Blood Loss (mL):  314 mL  Mom to postpartum.  Baby to Couplet care / Skin to Skin.  Leandre Wien JEHIEL 02/01/2015, 2:27 PM

## 2014-09-17 ENCOUNTER — Encounter: Payer: Self-pay | Admitting: Women's Health

## 2014-09-17 ENCOUNTER — Ambulatory Visit (INDEPENDENT_AMBULATORY_CARE_PROVIDER_SITE_OTHER): Payer: 59 | Admitting: Women's Health

## 2014-09-17 VITALS — BP 128/70 | Wt 193.0 lb

## 2014-09-17 DIAGNOSIS — Z331 Pregnant state, incidental: Secondary | ICD-10-CM

## 2014-09-17 DIAGNOSIS — Z1389 Encounter for screening for other disorder: Secondary | ICD-10-CM

## 2014-09-17 DIAGNOSIS — Z363 Encounter for antenatal screening for malformations: Secondary | ICD-10-CM

## 2014-09-17 DIAGNOSIS — Z3402 Encounter for supervision of normal first pregnancy, second trimester: Secondary | ICD-10-CM

## 2014-09-17 LAB — POCT URINALYSIS DIPSTICK
Blood, UA: NEGATIVE
GLUCOSE UA: NEGATIVE
KETONES UA: NEGATIVE
LEUKOCYTES UA: NEGATIVE
NITRITE UA: NEGATIVE
Protein, UA: NEGATIVE

## 2014-09-17 NOTE — Progress Notes (Signed)
Low-risk OB appointment G1P0 [redacted]w[redacted]d Estimated Date of Delivery: 02/21/15 BP 128/70 mmHg  Wt 193 lb (87.544 kg)  LMP 05/07/2014  BP, weight, and urine reviewed.  Refer to obstetrical flow sheet for FH & FHR.  No fm yet. Denies cramping, lof, vb, or uti s/s. Some mild occ cramping- to increase fluids. Some constipation- gave relief measures.  Reviewed ptl s/s. Plan:  Continue routine obstetrical care  F/U in 3wks for OB appointment and anatomy u/s

## 2014-09-17 NOTE — Patient Instructions (Signed)
Constipation  Drink plenty of fluid, preferably water, throughout the day  Eat foods high in fiber such as fruits, vegetables, and grains  Exercise, such as walking, is a good way to keep your bowels regular  Drink warm fluids, especially warm prune juice, or decaf coffee  Eat a 1/2 cup of real oatmeal (not instant), 1/2 cup applesauce, and 1/2-1 cup warm prune juice every day  If needed, you may take Colace (docusate sodium) stool softener once or twice a day to help keep the stool soft. If you are pregnant, wait until you are out of your first trimester (12-14 weeks of pregnancy)  If you still are having problems with constipation, you may take Miralax once daily as needed to help keep your bowels regular.  If you are pregnant, wait until you are out of your first trimester (12-14 weeks of pregnancy)    Second Trimester of Pregnancy The second trimester is from week 13 through week 28, months 4 through 6. The second trimester is often a time when you feel your best. Your body has also adjusted to being pregnant, and you begin to feel better physically. Usually, morning sickness has lessened or quit completely, you may have more energy, and you may have an increase in appetite. The second trimester is also a time when the fetus is growing rapidly. At the end of the sixth month, the fetus is about 9 inches long and weighs about 1 pounds. You will likely begin to feel the baby move (quickening) between 18 and 20 weeks of the pregnancy. BODY CHANGES Your body goes through many changes during pregnancy. The changes vary from woman to woman.  8. Your weight will continue to increase. You will notice your lower abdomen bulging out. 9. You may begin to get stretch marks on your hips, abdomen, and breasts. 10. You may develop headaches that can be relieved by medicines approved by your health care provider. 11. You may urinate more often because the fetus is pressing on your bladder. 12. You may  develop or continue to have heartburn as a result of your pregnancy. 13. You may develop constipation because certain hormones are causing the muscles that push waste through your intestines to slow down. 14. You may develop hemorrhoids or swollen, bulging veins (varicose veins). 15. You may have back pain because of the weight gain and pregnancy hormones relaxing your joints between the bones in your pelvis and as a result of a shift in weight and the muscles that support your balance. 16. Your breasts will continue to grow and be tender. 17. Your gums may bleed and may be sensitive to brushing and flossing. 18. Dark spots or blotches (chloasma, mask of pregnancy) may develop on your face. This will likely fade after the baby is born. 19. A dark line from your belly button to the pubic area (linea nigra) may appear. This will likely fade after the baby is born. 20. You may have changes in your hair. These can include thickening of your hair, rapid growth, and changes in texture. Some women also have hair loss during or after pregnancy, or hair that feels dry or thin. Your hair will most likely return to normal after your baby is born. WHAT TO EXPECT AT YOUR PRENATAL VISITS During a routine prenatal visit:  You will be weighed to make sure you and the fetus are growing normally.  Your blood pressure will be taken.  Your abdomen will be measured to track your baby's growth.  The fetal heartbeat will be listened to.  Any test results from the previous visit will be discussed. Your health care provider may ask you:  How you are feeling.  If you are feeling the baby move.  If you have had any abnormal symptoms, such as leaking fluid, bleeding, severe headaches, or abdominal cramping.  If you have any questions. Other tests that may be performed during your second trimester include:  Blood tests that check for:  Low iron levels (anemia).  Gestational diabetes (between 24 and 28  weeks).  Rh antibodies.  Urine tests to check for infections, diabetes, or protein in the urine.  An ultrasound to confirm the proper growth and development of the baby.  An amniocentesis to check for possible genetic problems.  Fetal screens for spina bifida and Down syndrome. HOME CARE INSTRUCTIONS   Avoid all smoking, herbs, alcohol, and unprescribed drugs. These chemicals affect the formation and growth of the baby.  Follow your health care provider's instructions regarding medicine use. There are medicines that are either safe or unsafe to take during pregnancy.  Exercise only as directed by your health care provider. Experiencing uterine cramps is a good sign to stop exercising.  Continue to eat regular, healthy meals.  Wear a good support bra for breast tenderness.  Do not use hot tubs, steam rooms, or saunas.  Wear your seat belt at all times when driving.  Avoid raw meat, uncooked cheese, cat litter boxes, and soil used by cats. These carry germs that can cause birth defects in the baby.  Take your prenatal vitamins.  Try taking a stool softener (if your health care provider approves) if you develop constipation. Eat more high-fiber foods, such as fresh vegetables or fruit and whole grains. Drink plenty of fluids to keep your urine clear or pale yellow.  Take warm sitz baths to soothe any pain or discomfort caused by hemorrhoids. Use hemorrhoid cream if your health care provider approves.  If you develop varicose veins, wear support hose. Elevate your feet for 15 minutes, 3-4 times a day. Limit salt in your diet.  Avoid heavy lifting, wear low heel shoes, and practice good posture.  Rest with your legs elevated if you have leg cramps or low back pain.  Visit your dentist if you have not gone yet during your pregnancy. Use a soft toothbrush to brush your teeth and be gentle when you floss.  A sexual relationship may be continued unless your health care provider  directs you otherwise.  Continue to go to all your prenatal visits as directed by your health care provider. SEEK MEDICAL CARE IF:   You have dizziness.  You have mild pelvic cramps, pelvic pressure, or nagging pain in the abdominal area.  You have persistent nausea, vomiting, or diarrhea.  You have a bad smelling vaginal discharge.  You have pain with urination. SEEK IMMEDIATE MEDICAL CARE IF:   You have a fever.  You are leaking fluid from your vagina.  You have spotting or bleeding from your vagina.  You have severe abdominal cramping or pain.  You have rapid weight gain or loss.  You have shortness of breath with chest pain.  You notice sudden or extreme swelling of your face, hands, ankles, feet, or legs.  You have not felt your baby move in over an hour.  You have severe headaches that do not go away with medicine.  You have vision changes. Document Released: 08/25/2001 Document Revised: 09/05/2013 Document Reviewed: 11/01/2012 [employer] Patient  Information 2015 [employer]. This information is not intended to replace advice given to you by your health care provider. Make sure you discuss any questions you have with your health care provider.  

## 2014-09-19 ENCOUNTER — Telehealth: Payer: Self-pay | Admitting: Women's Health

## 2014-09-19 NOTE — Telephone Encounter (Signed)
Pt states she is having sinus drainage and congestion with sore throat that gets worse at night.  Advised pt she could try taking Sudafed or Mucinex for the congestion and try warm salt water gargles, Tylenol and throat lozenges for the sore throat, also advised to push fluids and call us back if symptoms get worse or feels like she needs to be seen.  Pt verbalized understanding.

## 2014-10-08 ENCOUNTER — Ambulatory Visit (INDEPENDENT_AMBULATORY_CARE_PROVIDER_SITE_OTHER): Payer: 59

## 2014-10-08 ENCOUNTER — Encounter: Payer: Self-pay | Admitting: Women's Health

## 2014-10-08 ENCOUNTER — Ambulatory Visit (INDEPENDENT_AMBULATORY_CARE_PROVIDER_SITE_OTHER): Payer: 59 | Admitting: Women's Health

## 2014-10-08 VITALS — BP 120/64 | Wt 197.0 lb

## 2014-10-08 DIAGNOSIS — Z36 Encounter for antenatal screening of mother: Secondary | ICD-10-CM

## 2014-10-08 DIAGNOSIS — Z3402 Encounter for supervision of normal first pregnancy, second trimester: Secondary | ICD-10-CM

## 2014-10-08 DIAGNOSIS — Z1389 Encounter for screening for other disorder: Secondary | ICD-10-CM

## 2014-10-08 DIAGNOSIS — Z363 Encounter for antenatal screening for malformations: Secondary | ICD-10-CM

## 2014-10-08 DIAGNOSIS — Z331 Pregnant state, incidental: Secondary | ICD-10-CM

## 2014-10-08 LAB — POCT URINALYSIS DIPSTICK
Ketones, UA: NEGATIVE
LEUKOCYTES UA: NEGATIVE
Nitrite, UA: NEGATIVE
PROTEIN UA: NEGATIVE
RBC UA: NEGATIVE

## 2014-10-08 NOTE — Progress Notes (Signed)
Low-risk OB appointment G1P0 517w4d Estimated Date of Delivery: 02/21/15 LMP 05/07/2014  BP, weight, and urine reviewed.  Refer to obstetrical flow sheet for FH & FHR.  Reports good fm.  Denies regular uc's, lof, vb, or uti s/s. Swelling in Rt foot when up for too long, has h/o stress fx in that foot. Recommended elevating as much as possible, compression hose if needed Reviewed today's normal anatomy u/s, ptl s/s, fm. Plan:  Continue routine obstetrical care  F/U in 4wks for OB appointment

## 2014-10-08 NOTE — Patient Instructions (Signed)
Second Trimester of Pregnancy The second trimester is from week 13 through week 28, months 4 through 6. The second trimester is often a time when you feel your best. Your body has also adjusted to being pregnant, and you begin to feel better physically. Usually, morning sickness has lessened or quit completely, you may have more energy, and you may have an increase in appetite. The second trimester is also a time when the fetus is growing rapidly. At the end of the sixth month, the fetus is about 9 inches long and weighs about 1 pounds. You will likely begin to feel the baby move (quickening) between 18 and 20 weeks of the pregnancy. BODY CHANGES Your body goes through many changes during pregnancy. The changes vary from woman to woman.   Your weight will continue to increase. You will notice your lower abdomen bulging out.  You may begin to get stretch marks on your hips, abdomen, and breasts.  You may develop headaches that can be relieved by medicines approved by your health care provider.  You may urinate more often because the fetus is pressing on your bladder.  You may develop or continue to have heartburn as a result of your pregnancy.  You may develop constipation because certain hormones are causing the muscles that push waste through your intestines to slow down.  You may develop hemorrhoids or swollen, bulging veins (varicose veins).  You may have back pain because of the weight gain and pregnancy hormones relaxing your joints between the bones in your pelvis and as a result of a shift in weight and the muscles that support your balance.  Your breasts will continue to grow and be tender.  Your gums may bleed and may be sensitive to brushing and flossing.  Dark spots or blotches (chloasma, mask of pregnancy) may develop on your face. This will likely fade after the baby is born.  A dark line from your belly button to the pubic area (linea nigra) may appear. This will likely fade  after the baby is born.  You may have changes in your hair. These can include thickening of your hair, rapid growth, and changes in texture. Some women also have hair loss during or after pregnancy, or hair that feels dry or thin. Your hair will most likely return to normal after your baby is born. WHAT TO EXPECT AT YOUR PRENATAL VISITS During a routine prenatal visit:  You will be weighed to make sure you and the fetus are growing normally.  Your blood pressure will be taken.  Your abdomen will be measured to track your baby's growth.  The fetal heartbeat will be listened to.  Any test results from the previous visit will be discussed. Your health care provider may ask you:  How you are feeling.  If you are feeling the baby move.  If you have had any abnormal symptoms, such as leaking fluid, bleeding, severe headaches, or abdominal cramping.  If you have any questions. Other tests that may be performed during your second trimester include:  Blood tests that check for:  Low iron levels (anemia).  Gestational diabetes (between 24 and 28 weeks).  Rh antibodies.  Urine tests to check for infections, diabetes, or protein in the urine.  An ultrasound to confirm the proper growth and development of the baby.  An amniocentesis to check for possible genetic problems.  Fetal screens for spina bifida and Down syndrome. HOME CARE INSTRUCTIONS   Avoid all smoking, herbs, alcohol, and unprescribed   drugs. These chemicals affect the formation and growth of the baby.  Follow your health care provider's instructions regarding medicine use. There are medicines that are either safe or unsafe to take during pregnancy.  Exercise only as directed by your health care provider. Experiencing uterine cramps is a good sign to stop exercising.  Continue to eat regular, healthy meals.  Wear a good support bra for breast tenderness.  Do not use hot tubs, steam rooms, or saunas.  Wear your  seat belt at all times when driving.  Avoid raw meat, uncooked cheese, cat litter boxes, and soil used by cats. These carry germs that can cause birth defects in the baby.  Take your prenatal vitamins.  Try taking a stool softener (if your health care provider approves) if you develop constipation. Eat more high-fiber foods, such as fresh vegetables or fruit and whole grains. Drink plenty of fluids to keep your urine clear or pale yellow.  Take warm sitz baths to soothe any pain or discomfort caused by hemorrhoids. Use hemorrhoid cream if your health care provider approves.  If you develop varicose veins, wear support hose. Elevate your feet for 15 minutes, 3-4 times a day. Limit salt in your diet.  Avoid heavy lifting, wear low heel shoes, and practice good posture.  Rest with your legs elevated if you have leg cramps or low back pain.  Visit your dentist if you have not gone yet during your pregnancy. Use a soft toothbrush to brush your teeth and be gentle when you floss.  A sexual relationship may be continued unless your health care provider directs you otherwise.  Continue to go to all your prenatal visits as directed by your health care provider. SEEK MEDICAL CARE IF:   You have dizziness.  You have mild pelvic cramps, pelvic pressure, or nagging pain in the abdominal area.  You have persistent nausea, vomiting, or diarrhea.  You have a bad smelling vaginal discharge.  You have pain with urination. SEEK IMMEDIATE MEDICAL CARE IF:   You have a fever.  You are leaking fluid from your vagina.  You have spotting or bleeding from your vagina.  You have severe abdominal cramping or pain.  You have rapid weight gain or loss.  You have shortness of breath with chest pain.  You notice sudden or extreme swelling of your face, hands, ankles, feet, or legs.  You have not felt your baby move in over an hour.  You have severe headaches that do not go away with  medicine.  You have vision changes. Document Released: 08/25/2001 Document Revised: 09/05/2013 Document Reviewed: 11/01/2012 [employer] Patient Information 2015 [employer]. This information is not intended to replace advice given to you by your health care provider. Make sure you discuss any questions you have with your health care provider.  

## 2014-10-08 NOTE — Progress Notes (Signed)
U/S(20+4wks)-active fetus, meas c/w dates, fluid wnl, fundal Gr 0 placenta, cx appears closed (4.1cm), bilateral adnexa appears WNL, [id number] bpm, no obvious major abnl noted, female fetus

## 2014-11-05 ENCOUNTER — Ambulatory Visit (INDEPENDENT_AMBULATORY_CARE_PROVIDER_SITE_OTHER): Payer: 59 | Admitting: Women's Health

## 2014-11-05 ENCOUNTER — Encounter: Payer: Self-pay | Admitting: Women's Health

## 2014-11-05 VITALS — BP 138/76 | Wt 203.0 lb

## 2014-11-05 DIAGNOSIS — Z331 Pregnant state, incidental: Secondary | ICD-10-CM

## 2014-11-05 DIAGNOSIS — Z3402 Encounter for supervision of normal first pregnancy, second trimester: Secondary | ICD-10-CM

## 2014-11-05 DIAGNOSIS — Z1389 Encounter for screening for other disorder: Secondary | ICD-10-CM

## 2014-11-05 LAB — POCT URINALYSIS DIPSTICK
Blood, UA: NEGATIVE
GLUCOSE UA: NEGATIVE
Ketones, UA: NEGATIVE
Leukocytes, UA: NEGATIVE
Nitrite, UA: NEGATIVE
Protein, UA: NEGATIVE

## 2014-11-05 NOTE — Progress Notes (Signed)
Low-risk OB appointment G1P0 7611w4d Estimated Date of Delivery: 02/21/15 BP 138/76 mmHg  Wt 203 lb (92.08 kg)  LMP 05/07/2014  BP, weight, and urine reviewed.  Refer to obstetrical flow sheet for FH & FHR.  Reports good fm.  Denies regular uc's, lof, vb, or uti s/s. Hands/feet swelling, was ingesting a lot of sodium, has cut back some and swelling has improved. Denies ha, scotomata, ruq/epigastric pain, n/v.  Requests rx for breast pump- given.  Trace to +1 BLE edema today.  Reviewed ptl s/s, fm. Plan:  Continue routine obstetrical care  F/U in 4wks for OB appointment and PN2, also has blood work that her [employer] is requiring for work

## 2014-11-05 NOTE — Patient Instructions (Signed)
You will have your sugar test next visit.  Please do not eat or drink anything after midnight the night before you come, not even water.  You will be here for at least two hours.     Call the office ([phone]) or go to [facility] if:  You begin to have strong, frequent contractions  Your water breaks.  Sometimes it is a big gush of fluid, sometimes it is just a trickle that keeps getting your panties wet or running down your legs  You have vaginal bleeding.  It is normal to have a small amount of spotting if your cervix was checked.   You don't feel your baby moving like normal.  If you don't, get you something to eat and drink and lay down and focus on feeling your baby move.   If your baby is still not moving like normal, you should call the office or go to [facility].  Second Trimester of Pregnancy The second trimester is from week 13 through week 28, months 4 through 6. The second trimester is often a time when you feel your best. Your body has also adjusted to being pregnant, and you begin to feel better physically. Usually, morning sickness has lessened or quit completely, you may have more energy, and you may have an increase in appetite. The second trimester is also a time when the fetus is growing rapidly. At the end of the sixth month, the fetus is about 9 inches long and weighs about 1 pounds. You will likely begin to feel the baby move (quickening) between 18 and 20 weeks of the pregnancy. BODY CHANGES Your body goes through many changes during pregnancy. The changes vary from woman to woman.   Your weight will continue to increase. You will notice your lower abdomen bulging out.  You may begin to get stretch marks on your hips, abdomen, and breasts.  You may develop headaches that can be relieved by medicines approved by your health care provider.  You may urinate more often because the fetus is pressing on your bladder.  You may develop or continue to have  heartburn as a result of your pregnancy.  You may develop constipation because certain hormones are causing the muscles that push waste through your intestines to slow down.  You may develop hemorrhoids or swollen, bulging veins (varicose veins).  You may have back pain because of the weight gain and pregnancy hormones relaxing your joints between the bones in your pelvis and as a result of a shift in weight and the muscles that support your balance.  Your breasts will continue to grow and be tender.  Your gums may bleed and may be sensitive to brushing and flossing.  Dark spots or blotches (chloasma, mask of pregnancy) may develop on your face. This will likely fade after the baby is born.  A dark line from your belly button to the pubic area (linea nigra) may appear. This will likely fade after the baby is born.  You may have changes in your hair. These can include thickening of your hair, rapid growth, and changes in texture. Some women also have hair loss during or after pregnancy, or hair that feels dry or thin. Your hair will most likely return to normal after your baby is born. WHAT TO EXPECT AT YOUR PRENATAL VISITS During a routine prenatal visit:  You will be weighed to make sure you and the fetus are growing normally.  Your blood pressure will be taken.    Your abdomen will be measured to track your baby's growth.  The fetal heartbeat will be listened to.  Any test results from the previous visit will be discussed. Your health care provider may ask you:  How you are feeling.  If you are feeling the baby move.  If you have had any abnormal symptoms, such as leaking fluid, bleeding, severe headaches, or abdominal cramping.  If you have any questions. Other tests that may be performed during your second trimester include:  Blood tests that check for:  Low iron levels (anemia).  Gestational diabetes (between 24 and 28 weeks).  Rh antibodies.  Urine tests to check  for infections, diabetes, or protein in the urine.  An ultrasound to confirm the proper growth and development of the baby.  An amniocentesis to check for possible genetic problems.  Fetal screens for spina bifida and Down syndrome. HOME CARE INSTRUCTIONS   Avoid all smoking, herbs, alcohol, and unprescribed drugs. These chemicals affect the formation and growth of the baby.  Follow your health care provider's instructions regarding medicine use. There are medicines that are either safe or unsafe to take during pregnancy.  Exercise only as directed by your health care provider. Experiencing uterine cramps is a good sign to stop exercising.  Continue to eat regular, healthy meals.  Wear a good support bra for breast tenderness.  Do not use hot tubs, steam rooms, or saunas.  Wear your seat belt at all times when driving.  Avoid raw meat, uncooked cheese, cat litter boxes, and soil used by cats. These carry germs that can cause birth defects in the baby.  Take your prenatal vitamins.  Try taking a stool softener (if your health care provider approves) if you develop constipation. Eat more high-fiber foods, such as fresh vegetables or fruit and whole grains. Drink plenty of fluids to keep your urine clear or pale yellow.  Take warm sitz baths to soothe any pain or discomfort caused by hemorrhoids. Use hemorrhoid cream if your health care provider approves.  If you develop varicose veins, wear support hose. Elevate your feet for 15 minutes, 3-4 times a day. Limit salt in your diet.  Avoid heavy lifting, wear low heel shoes, and practice good posture.  Rest with your legs elevated if you have leg cramps or low back pain.  Visit your dentist if you have not gone yet during your pregnancy. Use a soft toothbrush to brush your teeth and be gentle when you floss.  A sexual relationship may be continued unless your health care provider directs you otherwise.  Continue to go to all your  prenatal visits as directed by your health care provider. SEEK MEDICAL CARE IF:   You have dizziness.  You have mild pelvic cramps, pelvic pressure, or nagging pain in the abdominal area.  You have persistent nausea, vomiting, or diarrhea.  You have a bad smelling vaginal discharge.  You have pain with urination. SEEK IMMEDIATE MEDICAL CARE IF:   You have a fever.  You are leaking fluid from your vagina.  You have spotting or bleeding from your vagina.  You have severe abdominal cramping or pain.  You have rapid weight gain or loss.  You have shortness of breath with chest pain.  You notice sudden or extreme swelling of your face, hands, ankles, feet, or legs.  You have not felt your baby move in over an hour.  You have severe headaches that do not go away with medicine.  You have vision changes.   Document Released: 08/25/2001 Document Revised: 09/05/2013 Document Reviewed: 11/01/2012 [employer] Patient Information 2015 [employer]. This information is not intended to replace advice given to you by your health care provider. Make sure you discuss any questions you have with your health care provider.     

## 2014-12-03 ENCOUNTER — Encounter: Payer: Self-pay | Admitting: Women's Health

## 2014-12-03 ENCOUNTER — Other Ambulatory Visit: Payer: 59

## 2014-12-03 ENCOUNTER — Ambulatory Visit (INDEPENDENT_AMBULATORY_CARE_PROVIDER_SITE_OTHER): Payer: 59 | Admitting: Women's Health

## 2014-12-03 VITALS — BP 120/70 | HR 76 | Wt 204.0 lb

## 2014-12-03 DIAGNOSIS — Z1389 Encounter for screening for other disorder: Secondary | ICD-10-CM

## 2014-12-03 DIAGNOSIS — Z3403 Encounter for supervision of normal first pregnancy, third trimester: Secondary | ICD-10-CM

## 2014-12-03 DIAGNOSIS — Z0000 Encounter for general adult medical examination without abnormal findings: Secondary | ICD-10-CM

## 2014-12-03 DIAGNOSIS — Z113 Encounter for screening for infections with a predominantly sexual mode of transmission: Secondary | ICD-10-CM

## 2014-12-03 DIAGNOSIS — Z131 Encounter for screening for diabetes mellitus: Secondary | ICD-10-CM

## 2014-12-03 DIAGNOSIS — Z0184 Encounter for antibody response examination: Secondary | ICD-10-CM

## 2014-12-03 DIAGNOSIS — Z114 Encounter for screening for human immunodeficiency virus [HIV]: Secondary | ICD-10-CM

## 2014-12-03 DIAGNOSIS — Z331 Pregnant state, incidental: Secondary | ICD-10-CM

## 2014-12-03 LAB — POCT URINALYSIS DIPSTICK
Blood, UA: NEGATIVE
Glucose, UA: NEGATIVE
Ketones, UA: NEGATIVE
Nitrite, UA: NEGATIVE
PROTEIN UA: NEGATIVE

## 2014-12-03 NOTE — Patient Instructions (Signed)
Call the office ([phone]) or go to [facility] if:  You begin to have strong, frequent contractions  Your water breaks.  Sometimes it is a big gush of fluid, sometimes it is just a trickle that keeps getting your panties wet or running down your legs  You have vaginal bleeding.  It is normal to have a small amount of spotting if your cervix was checked.   You don't feel your baby moving like normal.  If you don't, get you something to eat and drink and lay down and focus on feeling your baby move.  You should feel at least 10 movements in 2 hours.  If you don't, you should call the office or go to [facility].    Tdap Vaccine  It is recommended that you get the Tdap vaccine during the third trimester of EACH pregnancy to help protect your baby from getting pertussis (whooping cough)  27-36 weeks is the BEST time to do this so that you can pass the protection on to your baby. During pregnancy is better than after pregnancy, but if you are unable to get it during pregnancy it will be offered at the hospital.   You can get this vaccine at the health department or your family doctor  Everyone who will be around your baby should also be up-to-date on their vaccines. Adults (who are not pregnant) only need 1 dose of Tdap during adulthood.     Third Trimester of Pregnancy The third trimester is from week 29 through week 42, months 7 through 9. The third trimester is a time when the fetus is growing rapidly. At the end of the ninth month, the fetus is about 20 inches in length and weighs 6-10 pounds.  BODY CHANGES Your body goes through many changes during pregnancy. The changes vary from woman to woman.  8. Your weight will continue to increase. You can expect to gain 25-35 pounds (11-16 kg) by the end of the pregnancy. 9. You may begin to get stretch marks on your hips, abdomen, and breasts. 10. You may urinate more often because the fetus is moving lower into your pelvis and pressing  on your bladder. 11. You may develop or continue to have heartburn as a result of your pregnancy. 12. You may develop constipation because certain hormones are causing the muscles that push waste through your intestines to slow down. 13. You may develop hemorrhoids or swollen, bulging veins (varicose veins). 14. You may have pelvic pain because of the weight gain and pregnancy hormones relaxing your joints between the bones in your pelvis. Backaches may result from overexertion of the muscles supporting your posture. 15. You may have changes in your hair. These can include thickening of your hair, rapid growth, and changes in texture. Some women also have hair loss during or after pregnancy, or hair that feels dry or thin. Your hair will most likely return to normal after your baby is born. 16. Your breasts will continue to grow and be tender. A yellow discharge may leak from your breasts called colostrum. 17. Your belly button may stick out. 18. You may feel short of breath because of your expanding uterus. 19. You may notice the fetus "dropping," or moving lower in your abdomen. 20. You may have a bloody mucus discharge. This usually occurs a few days to a week before labor begins. 21. Your cervix becomes thin and soft (effaced) near your due date. WHAT TO EXPECT AT YOUR PRENATAL EXAMS  You will   have prenatal exams every 2 weeks until week 36. Then, you will have weekly prenatal exams. During a routine prenatal visit: 2. You will be weighed to make sure you and the fetus are growing normally. 3. Your blood pressure is taken. 4. Your abdomen will be measured to track your baby's growth. 5. The fetal heartbeat will be listened to. 6. Any test results from the previous visit will be discussed. 7. You may have a cervical check near your due date to see if you have effaced. At around 36 weeks, your caregiver will check your cervix. At the same time, your caregiver will also perform a test on the  secretions of the vaginal tissue. This test is to determine if a type of bacteria, Group B streptococcus, is present. Your caregiver will explain this further. Your caregiver may ask you:  What your birth plan is.  How you are feeling.  If you are feeling the baby move.  If you have had any abnormal symptoms, such as leaking fluid, bleeding, severe headaches, or abdominal cramping.  If you have any questions. Other tests or screenings that may be performed during your third trimester include:  Blood tests that check for low iron levels (anemia).  Fetal testing to check the health, activity level, and growth of the fetus. Testing is done if you have certain medical conditions or if there are problems during the pregnancy. FALSE LABOR You may feel small, irregular contractions that eventually go away. These are called Braxton Hicks contractions, or false labor. Contractions may last for hours, days, or even weeks before true labor sets in. If contractions come at regular intervals, intensify, or become painful, it is best to be seen by your caregiver.  SIGNS OF LABOR   Menstrual-like cramps.  Contractions that are 5 minutes apart or less.  Contractions that start on the top of the uterus and spread down to the lower abdomen and back.  A sense of increased pelvic pressure or back pain.  A watery or bloody mucus discharge that comes from the vagina. If you have any of these signs before the 37th week of pregnancy, call your caregiver right away. You need to go to the hospital to get checked immediately. HOME CARE INSTRUCTIONS   Avoid all smoking, herbs, alcohol, and unprescribed drugs. These chemicals affect the formation and growth of the baby.  Follow your caregiver's instructions regarding medicine use. There are medicines that are either safe or unsafe to take during pregnancy.  Exercise only as directed by your caregiver. Experiencing uterine cramps is a good sign to stop  exercising.  Continue to eat regular, healthy meals.  Wear a good support bra for breast tenderness.  Do not use hot tubs, steam rooms, or saunas.  Wear your seat belt at all times when driving.  Avoid raw meat, uncooked cheese, cat litter boxes, and soil used by cats. These carry germs that can cause birth defects in the baby.  Take your prenatal vitamins.  Try taking a stool softener (if your caregiver approves) if you develop constipation. Eat more high-fiber foods, such as fresh vegetables or fruit and whole grains. Drink plenty of fluids to keep your urine clear or pale yellow.  Take warm sitz baths to soothe any pain or discomfort caused by hemorrhoids. Use hemorrhoid cream if your caregiver approves.  If you develop varicose veins, wear support hose. Elevate your feet for 15 minutes, 3-4 times a day. Limit salt in your diet.  Avoid heavy lifting, wear   low heal shoes, and practice good posture.  Rest a lot with your legs elevated if you have leg cramps or low back pain.  Visit your dentist if you have not gone during your pregnancy. Use a soft toothbrush to brush your teeth and be gentle when you floss.  A sexual relationship may be continued unless your caregiver directs you otherwise.  Do not travel far distances unless it is absolutely necessary and only with the approval of your caregiver.  Take prenatal classes to understand, practice, and ask questions about the labor and delivery.  Make a trial run to the hospital.  Pack your hospital bag.  Prepare the baby's nursery.  Continue to go to all your prenatal visits as directed by your caregiver. SEEK MEDICAL CARE IF:  You are unsure if you are in labor or if your water has broken.  You have dizziness.  You have mild pelvic cramps, pelvic pressure, or nagging pain in your abdominal area.  You have persistent nausea, vomiting, or diarrhea.  You have a bad smelling vaginal discharge.  You have pain with  urination. SEEK IMMEDIATE MEDICAL CARE IF:   You have a fever.  You are leaking fluid from your vagina.  You have spotting or bleeding from your vagina.  You have severe abdominal cramping or pain.  You have rapid weight loss or gain.  You have shortness of breath with chest pain.  You notice sudden or extreme swelling of your face, hands, ankles, feet, or legs.  You have not felt your baby move in over an hour.  You have severe headaches that do not go away with medicine.  You have vision changes. Document Released: 08/25/2001 Document Revised: 09/05/2013 Document Reviewed: 11/01/2012 ExitCare Patient Information 2015 [employer]. This information is not intended to replace advice given to you by your health care provider. Make sure you discuss any questions you have with your health care provider.  

## 2014-12-03 NOTE — Progress Notes (Signed)
Low-risk OB appointment G1P0 1596w4d Estimated Date of Delivery: 02/21/15 BP 120/70 mmHg  Pulse 76  Wt 204 lb (92.534 kg)  LMP 05/07/2014  BP, weight, and urine reviewed.  Refer to obstetrical flow sheet for FH & FHR.  Reports good fm.  Denies regular uc's, lof, vb, or uti s/s. No complaints. Scheduled for cb classes! Employer ([employer]) is requiring annual labs including A1C and Lipid panel- these were added onto PN2 today.  Reviewed ptl s/s, fkc. Recommended Tdap at HD/PCP per CDC guidelines.  Plan:  Continue routine obstetrical care  F/U in 4wks for OB appointment  PN2 today

## 2014-12-04 ENCOUNTER — Telehealth: Payer: Self-pay | Admitting: Women's Health

## 2014-12-04 LAB — CBC
HCT: 39 % (ref 34.0–46.6)
Hemoglobin: 12.8 g/dL (ref 11.1–15.9)
MCH: 29.2 pg (ref 26.6–33.0)
MCHC: 32.8 g/dL (ref 31.5–35.7)
MCV: 89 fL (ref 79–97)
Platelets: 161 10*3/uL (ref 150–379)
RBC: 4.38 x10E6/uL (ref 3.77–5.28)
RDW: 13.9 % (ref 12.3–15.4)
WBC: 7.3 10*3/uL (ref 3.4–10.8)

## 2014-12-04 LAB — HEMOGLOBIN A1C
Est. average glucose Bld gHb Est-mCnc: 100 mg/dL
Hgb A1c MFr Bld: 5.1 % (ref 4.8–5.6)

## 2014-12-04 LAB — LIPID PANEL
CHOL/HDL RATIO: 3.1 ratio (ref 0.0–4.4)
CHOLESTEROL TOTAL: 204 mg/dL — AB (ref 100–199)
HDL: 66 mg/dL (ref >39)
LDL CALC: 92 mg/dL (ref 0–99)
Triglycerides: 228 mg/dL — ABNORMAL HIGH (ref 0–149)
VLDL CHOLESTEROL CAL: 46 mg/dL — AB (ref 5–40)

## 2014-12-04 LAB — GLUCOSE TOLERANCE, 2 HOURS W/ 1HR
GLUCOSE, 1 HOUR: 117 mg/dL (ref 65–179)
GLUCOSE, 2 HOUR: 107 mg/dL (ref 65–152)
GLUCOSE, FASTING: 79 mg/dL (ref 65–91)

## 2014-12-04 LAB — ANTIBODY SCREEN: Antibody Screen: NEGATIVE

## 2014-12-04 LAB — HSV 2 ANTIBODY, IGG: HSV 2 Glycoprotein G Ab, IgG: <0.91 index (ref 0.00–0.90)

## 2014-12-04 LAB — HIV ANTIBODY (ROUTINE TESTING W REFLEX): HIV Screen 4th Generation wRfx: NONREACTIVE

## 2014-12-04 LAB — RPR: RPR Ser Ql: NONREACTIVE

## 2014-12-04 NOTE — Telephone Encounter (Signed)
Left message for pt to return call to notify of labs. Lipid panel abnormal, but likely d/t drinking glucola 1st.  Cheral MarkerKimberly R. Ashlei Chinchilla, CNM, [facility]NP-BC 12/04/2014 10:46 AM

## 2014-12-31 ENCOUNTER — Ambulatory Visit (INDEPENDENT_AMBULATORY_CARE_PROVIDER_SITE_OTHER): Payer: 59 | Admitting: Women's Health

## 2014-12-31 ENCOUNTER — Encounter: Payer: Self-pay | Admitting: Women's Health

## 2014-12-31 VITALS — BP 132/82 | HR 72 | Wt 210.0 lb

## 2014-12-31 DIAGNOSIS — Z3403 Encounter for supervision of normal first pregnancy, third trimester: Secondary | ICD-10-CM

## 2014-12-31 DIAGNOSIS — Z1389 Encounter for screening for other disorder: Secondary | ICD-10-CM

## 2014-12-31 DIAGNOSIS — Z331 Pregnant state, incidental: Secondary | ICD-10-CM

## 2014-12-31 LAB — POCT URINALYSIS DIPSTICK
Blood, UA: NEGATIVE
Glucose, UA: NEGATIVE
KETONES UA: NEGATIVE
Leukocytes, UA: NEGATIVE
Nitrite, UA: NEGATIVE

## 2014-12-31 NOTE — Patient Instructions (Signed)
[place] Pediatricians/Family Doctors:  [facility] [phone]            [facility] [phone]                 [facility] [phone] (usually not accepting new patients unless you have family there already, you are always welcome to call and ask)            [facility] ([address] [place]) [phone]   [place] Pediatricians/Family Doctors:   [facility]: [phone]  [facility]: [phone]   Call the office ([phone]) or go to [facility] if:  You begin to have strong, frequent contractions  Your water breaks.  Sometimes it is a big gush of fluid, sometimes it is just a trickle that keeps getting your panties wet or running down your legs  You have vaginal bleeding.  It is normal to have a small amount of spotting if your cervix was checked.   You don't feel your baby moving like normal.  If you don't, get you something to eat and drink and lay down and focus on feeling your baby move.  You should feel at least 10 movements in 2 hours.  If you don't, you should call the office or go to [facility].    Preterm Labor Information Preterm labor is when labor starts at less than 37 weeks of pregnancy. The normal length of a pregnancy is 39 to 41 weeks. CAUSES Often, there is no identifiable underlying cause as to why a woman goes into preterm labor. One of the most common known causes of preterm labor is infection. Infections of the uterus, cervix, vagina, amniotic sac, bladder, kidney, or even the lungs (pneumonia) can cause labor to start. Other suspected causes of preterm labor include:   Urogenital infections, such as yeast infections and bacterial vaginosis.   Uterine abnormalities (uterine shape, uterine septum, fibroids, or bleeding from the placenta).   A cervix that has been operated on (it may fail to stay closed).   Malformations in the fetus.   Multiple  gestations (twins, triplets, and so on).   Breakage of the amniotic sac.  RISK FACTORS  Having a previous history of preterm labor.   Having premature rupture of membranes (PROM).   Having a placenta that covers the opening of the cervix (placenta previa).   Having a placenta that separates from the uterus (placental abruption).   Having a cervix that is too weak to hold the fetus in the uterus (incompetent cervix).   Having too much fluid in the amniotic sac (polyhydramnios).   Taking illegal drugs or smoking while pregnant.   Not gaining enough weight while pregnant.   Being younger than 18 and older than 27 years old.   Having a low socioeconomic status.   Being African American. SYMPTOMS Signs and symptoms of preterm labor include:   Menstrual-like cramps, abdominal pain, or back pain.  Uterine contractions that are regular, as frequent as six in an hour, regardless of their intensity (may be mild or painful).  Contractions that start on the top of the uterus and spread down to the lower abdomen and back.   A sense of increased pelvic pressure.   A watery or bloody mucus discharge that comes from the vagina.  TREATMENT Depending on the length of the pregnancy and other circumstances, your health care provider may suggest bed rest. If necessary, there are medicines that can be given to stop contractions and   to mature the fetal lungs. If labor happens before 34 weeks of pregnancy, a prolonged hospital stay may be recommended. Treatment depends on the condition of both you and the fetus.  WHAT SHOULD YOU DO IF YOU THINK YOU ARE IN PRETERM LABOR? Call your health care provider right away. You will need to go to the hospital to get checked immediately. HOW CAN YOU PREVENT PRETERM LABOR IN FUTURE PREGNANCIES? You should:   Stop smoking if you smoke.  Maintain healthy weight gain and avoid chemicals and drugs that are not necessary.  Be watchful for any  type of infection.  Inform your health care provider if you have a known history of preterm labor. Document Released: 11/21/2003 Document Revised: 05/03/2013 Document Reviewed: 10/03/2012 ExitCare Patient Information 2015 [employer]. This information is not intended to replace advice given to you by your health care provider. Make sure you discuss any questions you have with your health care provider.  

## 2014-12-31 NOTE — Progress Notes (Signed)
Low-risk OB appointment G1P0 7255w4d Estimated Date of Delivery: 02/21/15 BP 132/82 mmHg  Pulse 72  Wt 210 lb (95.255 kg)  LMP 05/07/2014  BP, weight, and urine reviewed.  Refer to obstetrical flow sheet for FH & FHR.  Reports good fm.  Denies regular uc's, lof, vb, or uti s/s. Some swelling in feet, Denies ha, scotomata, ruq/epigastric pain, n/v.   Reviewed pn2 labs, ptl s/s, fkc, pre-e s/s. Plan:  Continue routine obstetrical care  F/U in 2wks for OB appointment

## 2015-01-13 ENCOUNTER — Inpatient Hospital Stay (HOSPITAL_COMMUNITY)
Admission: AD | Admit: 2015-01-13 | Discharge: 2015-01-13 | Disposition: A | Discharge status: Home / Self Care | Payer: 59 | Source: Ambulatory Visit | Attending: Obstetrics and Gynecology | Admitting: Obstetrics and Gynecology

## 2015-01-13 ENCOUNTER — Encounter (HOSPITAL_COMMUNITY): Payer: Self-pay

## 2015-01-13 DIAGNOSIS — Z3A34 34 weeks gestation of pregnancy: Secondary | ICD-10-CM | POA: Diagnosis not present

## 2015-01-13 DIAGNOSIS — O139 Gestational [pregnancy-induced] hypertension without significant proteinuria, unspecified trimester: Secondary | ICD-10-CM

## 2015-01-13 DIAGNOSIS — O133 Gestational [pregnancy-induced] hypertension without significant proteinuria, third trimester: Secondary | ICD-10-CM

## 2015-01-13 DIAGNOSIS — O368130 Decreased fetal movements, third trimester, not applicable or unspecified: Secondary | ICD-10-CM | POA: Diagnosis present

## 2015-01-13 LAB — CBC
HCT: 36.5 % (ref 36.0–46.0)
HEMOGLOBIN: 12.4 g/dL (ref 12.0–15.0)
MCH: 29.2 pg (ref 26.0–34.0)
MCHC: 34 g/dL (ref 30.0–36.0)
MCV: 86.1 fL (ref 78.0–100.0)
Platelets: 140 10*3/uL — ABNORMAL LOW (ref 150–400)
RBC: 4.24 MIL/uL (ref 3.87–5.11)
RDW: 13.8 % (ref 11.5–15.5)
WBC: 8.5 10*3/uL (ref 4.0–10.5)

## 2015-01-13 LAB — COMPREHENSIVE METABOLIC PANEL
ALBUMIN: 3 g/dL — AB (ref 3.5–5.0)
ALT: 14 U/L (ref 14–54)
ANION GAP: 8 (ref 5–15)
AST: 18 U/L (ref 15–41)
Alkaline Phosphatase: 120 U/L (ref 38–126)
BUN: 9 mg/dL (ref 6–20)
CALCIUM: 8.8 mg/dL — AB (ref 8.9–10.3)
CO2: 24 mmol/L (ref 22–32)
Chloride: 105 mmol/L (ref 101–111)
Creatinine, Ser: 0.5 mg/dL (ref 0.44–1.00)
GLUCOSE: 84 mg/dL (ref 70–99)
Potassium: 4.4 mmol/L (ref 3.5–5.1)
SODIUM: 137 mmol/L (ref 135–145)
Total Bilirubin: 0.3 mg/dL (ref 0.3–1.2)
Total Protein: 6.2 g/dL — ABNORMAL LOW (ref 6.5–8.1)

## 2015-01-13 LAB — PROTEIN / CREATININE RATIO, URINE
Creatinine, Urine: 33 mg/dL
Total Protein, Urine: <6 mg/dL

## 2015-01-13 NOTE — Discharge Instructions (Signed)

## 2015-01-13 NOTE — MAU Provider Note (Signed)
  History   G1 at 34.3 wks in with c/o decreased fetal movement today. Denies any other complaints.   CSN: [id number]  Arrival date and time: 01/13/15 2017   First Provider Initiated Contact with Patient 01/13/15 2100      Chief Complaint  Patient presents with  . Decreased Fetal Movement   HPI  OB History    Gravida Para Term Preterm AB TAB SAB Ectopic Multiple Living   1               Past Medical History  Diagnosis Date  . Pregnant 06/19/2014  . Supervision of normal pregnancy in first trimester 06/29/2014     [facility]  Roxana HiresChris  [place] 29 yo wm 1st Dating By US Pap 10/06/12 GC/CT Initial:                36+wks: Genetic Screen NT/IT:  CF screen  Anatomic US  Flu vaccine  Tdap Recommended ~ 28wks Glucose Screen  2 hr GBS  Feed Preference  Contraception  Circumcision  Childbirth Classes  Pediatrician      Past Surgical History  Procedure Laterality Date  . Cholecystectomy      Family History  Problem Relation Age of Onset  . Diabetes Paternal Grandmother   . COPD Father   . Asthma Father   . Hypertension Mother   . Emphysema Paternal Grandfather     History  Substance Use Topics  . Smoking status: Never Smoker   . Smokeless tobacco: Never Used  . Alcohol Use: No    Allergies: No Known Allergies  Prescriptions prior to admission  Medication Sig Dispense Refill Last Dose  . Pediatric Multiple Vit-C-FA (FLINSTONES GUMMIES OMEGA-3 DHA) CHEW Chew by mouth. Take 2 daily.   Not Taking  . Prenatal Vit-Fe Fumarate-FA (PRENATAL MULTIVITAMIN) TABS tablet Take 1 tablet by mouth daily at 12 noon.   Taking    Review of Systems  Constitutional: Negative.   HENT: Negative.   Eyes: Negative.   Respiratory: Negative.   Cardiovascular: Negative.   Gastrointestinal: Negative.   Genitourinary: Negative.   Musculoskeletal: Negative.   Skin: Negative.   Neurological: Negative.   Endo/Heme/Allergies: Negative.   Psychiatric/Behavioral: Negative.    Physical  Exam   Blood pressure 129/104, pulse 105, resp. rate 18, last menstrual period 05/07/2014, SpO2 98 %.  Physical Exam  Constitutional: She is oriented to person, place, and time. She appears well-developed and well-nourished.  HENT:  Head: Normocephalic.  Eyes: Pupils are equal, round, and reactive to light.  Neck: Normal range of motion.  Cardiovascular: Normal rate, regular rhythm, normal heart sounds and intact distal pulses.   Respiratory: Effort normal and breath sounds normal.  GI: Soft. Bowel sounds are normal.  Genitourinary: Vagina normal.  Musculoskeletal: Normal range of motion.  Neurological: She is alert and oriented to person, place, and time. She has normal reflexes.  Skin: Skin is warm and dry.  Psychiatric: She has a normal mood and affect. Her behavior is normal. Judgment and thought content normal.    MAU Course  Procedures  MDM Gestational hypertension. Stable fetus  Assessment and Plan  Stable fetus with reactive strip. Will draw PIH labs if normal will d/c home [facility].  Wyvonnia DuskyLAWSON, Zamzam Whinery DARLENE 01/13/2015, 9:04 PM

## 2015-01-13 NOTE — MAU Note (Signed)
No fetal movement since this morning. Denies vaginal bleeding or LOF. Denies complications with pregnancy.

## 2015-01-14 ENCOUNTER — Ambulatory Visit (INDEPENDENT_AMBULATORY_CARE_PROVIDER_SITE_OTHER): Payer: 59 | Admitting: Women's Health

## 2015-01-14 VITALS — BP 150/94 | HR 106 | Wt 215.0 lb

## 2015-01-14 DIAGNOSIS — O133 Gestational [pregnancy-induced] hypertension without significant proteinuria, third trimester: Secondary | ICD-10-CM

## 2015-01-14 DIAGNOSIS — Z1389 Encounter for screening for other disorder: Secondary | ICD-10-CM

## 2015-01-14 DIAGNOSIS — Z331 Pregnant state, incidental: Secondary | ICD-10-CM

## 2015-01-14 DIAGNOSIS — Z3493 Encounter for supervision of normal pregnancy, unspecified, third trimester: Secondary | ICD-10-CM

## 2015-01-14 DIAGNOSIS — O09893 Supervision of other high risk pregnancies, third trimester: Secondary | ICD-10-CM

## 2015-01-14 LAB — POCT URINALYSIS DIPSTICK
Blood, UA: NEGATIVE
Glucose, UA: NEGATIVE
Ketones, UA: NEGATIVE
LEUKOCYTES UA: NEGATIVE
Nitrite, UA: NEGATIVE

## 2015-01-14 NOTE — Progress Notes (Signed)
High Risk Pregnancy Diagnosis(es): GHTN dx yesterday in MAU G1P0 136w4d Estimated Date of Delivery: 02/21/15 BP 150/94 mmHg  Pulse 106  Wt 215 lb (97.523 kg)  LMP 05/07/2014  Urinalysis: Positive for trace protein HPI:  Went to [facility] for decreased fm last night, reactive nst, elevated bp's- dx w/ GHTN, pre-e labs were drawn, plt 140, p:c too low to detect, LFTs, h/h normal BP, weight, and urine reviewed.  Reports good fm. Denies regular uc's, lof, vb, uti s/s. No complaints.  Fundal Height:  35 Fetal Heart rate:  140, reactive NST Edema:  1+ DTRs 2+ No clonus  Reviewed ptl s/s, fkc, pre-e s/s All questions were answered Assessment: 4336w4d GHTN Medication(s) Plans:  None for now Treatment Plan:  2x/wk testing nst/sono, deliver @37wks  Follow up in 3d for high-risk OB appt and efw/afi/bpp/dopp u/s

## 2015-01-14 NOTE — Patient Instructions (Signed)
Call the office ([phone]) or go to [facility] if:  You begin to have strong, frequent contractions  Your water breaks.  Sometimes it is a big gush of fluid, sometimes it is just a trickle that keeps getting your panties wet or running down your legs  You have vaginal bleeding.  It is normal to have a small amount of spotting if your cervix was checked.   You don't feel your baby moving like normal.  If you don't, get you something to eat and drink and lay down and focus on feeling your baby move.  You should feel at least 10 movements in 2 hours.  If you don't, you should call the office or go to [facility].    Call the office ([phone]) or go to [facility] for these signs of pre-eclampsia:  Severe headache that does not go away with Tylenol  Visual changes- seeing spots, double, blurred vision  Pain under your right breast or upper abdomen that does not go away with Tums or heartburn medicine  Nausea and/or vomiting  Severe swelling in your hands, feet, and face    Hypertension During Pregnancy Hypertension, or high blood pressure, is when there is extra pressure inside your blood vessels that carry blood from the heart to the rest of your body (arteries). It can happen at any time in life, including pregnancy. Hypertension during pregnancy can cause problems for you and your baby. Your baby might not weigh as much as he or she should at birth or might be born early (premature). Very bad cases of hypertension during pregnancy can be life-threatening.  Different types of hypertension can occur during pregnancy. These include:  Chronic hypertension. This happens when a woman has hypertension before pregnancy and it continues during pregnancy.  Gestational hypertension. This is when hypertension develops during pregnancy.  Preeclampsia or toxemia of pregnancy. This is a very serious type of hypertension that develops only during pregnancy. It affects the whole body and  can be very dangerous for both mother and baby.  Gestational hypertension and preeclampsia usually go away after your baby is born. Your blood pressure will likely stabilize within 6 weeks. Women who have hypertension during pregnancy have a greater chance of developing hypertension later in life or with future pregnancies. RISK FACTORS There are certain factors that make it more likely for you to develop hypertension during pregnancy. These include:  Having hypertension before pregnancy.  Having hypertension during a previous pregnancy.  Being overweight.  Being older than 40 years.  Being pregnant with more than one baby.  Having diabetes or kidney problems. SIGNS AND SYMPTOMS Chronic and gestational hypertension rarely cause symptoms. Preeclampsia has symptoms, which may include:  Increased protein in your urine. Your health care provider will check for this at every prenatal visit.  Swelling of your hands and face.  Rapid weight gain.  Headaches.  Visual changes.  Being bothered by light.  Abdominal pain, especially in the upper right area.  Chest pain.  Shortness of breath.  Increased reflexes.  Seizures. These occur with a more severe form of preeclampsia, called eclampsia. DIAGNOSIS  You may be diagnosed with hypertension during a regular prenatal exam. At each prenatal visit, you may have:  Your blood pressure checked.  A urine test to check for protein in your urine. The type of hypertension you are diagnosed with depends on when you developed it. It also depends on your specific blood pressure reading.  Developing hypertension before 20 weeks of   pregnancy is consistent with chronic hypertension.  Developing hypertension after 20 weeks of pregnancy is consistent with gestational hypertension.  Hypertension with increased urinary protein is diagnosed as preeclampsia.  Blood pressure measurements that stay above 160 systolic or 110 diastolic are a sign of  severe preeclampsia. TREATMENT Treatment for hypertension during pregnancy varies. Treatment depends on the type of hypertension and how serious it is.  If you take medicine for chronic hypertension, you may need to switch medicines.  Medicines called ACE inhibitors should not be taken during pregnancy.  Low-dose aspirin may be suggested for women who have risk factors for preeclampsia.  If you have gestational hypertension, you may need to take a blood pressure medicine that is safe during pregnancy. Your health care provider will recommend the correct medicine.  If you have severe preeclampsia, you may need to be in the hospital. Health care providers will watch you and your baby very closely. You also may need to take medicine called magnesium sulfate to prevent seizures and lower blood pressure.  Sometimes, an early delivery is needed. This may be the case if the condition worsens. It would be done to protect you and your baby. The only cure for preeclampsia is delivery.  Your health care provider may recommend that you take one low-dose aspirin (81 mg) each day to help prevent high blood pressure during your pregnancy if you are at risk for preeclampsia. You may be at risk for preeclampsia if:  You had preeclampsia or eclampsia during a previous pregnancy.  Your baby did not grow as expected during a previous pregnancy.  You experienced preterm birth with a previous pregnancy.  You experienced a separation of the placenta from the uterus (placental abruption) during a previous pregnancy.  You experienced the loss of your baby during a previous pregnancy.  You are pregnant with more than one baby.  You have other medical conditions, such as diabetes or an autoimmune disease. HOME CARE INSTRUCTIONS  Schedule and keep all of your regular prenatal care appointments. This is important.  Take medicines only as directed by your health care provider. Tell your health care provider  about all medicines you take.  Eat as little salt as possible.  Get regular exercise.  Do not drink alcohol.  Do not use tobacco products.  Do not drink products with caffeine.  Lie on your left side when resting. SEEK IMMEDIATE MEDICAL CARE IF:  You have severe abdominal pain.  You have sudden swelling in your hands, ankles, or face.  You gain 4 pounds (1.8 kg) or more in 1 week.  You vomit repeatedly.  You have vaginal bleeding.  You do not feel your baby moving as much.  You have a headache.  You have blurred or double vision.  You have muscle twitching or spasms.  You have shortness of breath.  You have blue fingernails or lips.  You have blood in your urine. MAKE SURE YOU:  Understand these instructions.  Will watch your condition.  Will get help right away if you are not doing well or get worse. Document Released: 05/19/2011 Document Revised: 01/15/2014 Document Reviewed: 03/30/2013 Exit[employer] Patient Information 2015 [employer]. This information is not intended to replace advice given to you by your health care provider. Make sure you discuss any questions you have with your health care provider.  

## 2015-01-18 ENCOUNTER — Ambulatory Visit (INDEPENDENT_AMBULATORY_CARE_PROVIDER_SITE_OTHER): Payer: 59

## 2015-01-18 ENCOUNTER — Ambulatory Visit (INDEPENDENT_AMBULATORY_CARE_PROVIDER_SITE_OTHER): Payer: 59 | Admitting: Obstetrics and Gynecology

## 2015-01-18 ENCOUNTER — Encounter: Payer: Self-pay | Admitting: Obstetrics and Gynecology

## 2015-01-18 VITALS — BP 132/80 | HR 84 | Wt 213.0 lb

## 2015-01-18 DIAGNOSIS — O133 Gestational [pregnancy-induced] hypertension without significant proteinuria, third trimester: Secondary | ICD-10-CM | POA: Diagnosis not present

## 2015-01-18 DIAGNOSIS — Z3403 Encounter for supervision of normal first pregnancy, third trimester: Secondary | ICD-10-CM

## 2015-01-18 DIAGNOSIS — O09893 Supervision of other high risk pregnancies, third trimester: Secondary | ICD-10-CM

## 2015-01-18 DIAGNOSIS — Z331 Pregnant state, incidental: Secondary | ICD-10-CM

## 2015-01-18 DIAGNOSIS — Z1389 Encounter for screening for other disorder: Secondary | ICD-10-CM

## 2015-01-18 LAB — POCT URINALYSIS DIPSTICK
Blood, UA: NEGATIVE
Glucose, UA: NEGATIVE
KETONES UA: NEGATIVE
LEUKOCYTES UA: NEGATIVE
Nitrite, UA: NEGATIVE
Protein, UA: 1

## 2015-01-18 NOTE — Progress Notes (Signed)
US [redacted]W[redacted]D, EFW 2274G (5LBS) 22.4% HC/AC 1.07(.093-1.11),cephalic,fundal pl gr 3,normal ov's, afi 14.4cm,fht 128bpm

## 2015-01-18 NOTE — Progress Notes (Signed)
Patient ID: Tiffany Bass, female   DOB: 07/12/1986, 29 y.o.   MRN: [id number]  Fetal Surveillance Testing today:  U/s    High Risk Pregnancy Diagnosis(es):   GHTN  G1P0 5034w1d Estimated Date of Delivery: 02/21/15  Blood pressure 132/80, pulse 84, weight 213 lb (96.616 kg), last menstrual period 05/07/2014.  Urinalysis: Positive for 1+ protein    HPI: The patient is being seen today for ongoing management of GHTN recently diagnosed. Today she reports no complaints. She denies any headaches or burred vision. Patient adds that she doesn't tend to have high blood pressure. Still working at [employer]  FOB weighed about 9 lbs at birth.   BP weight and urine results all reviewed and noted. Patient reports good fetal movement, denies any bleeding and no rupture of membranes symptoms or regular contractions.  Fundal Height:  35 cm Fetal Heart rate:  141 bpm Edema:  1+ improved Physical Exam  DTR's are 2+ Baby EFW 5+ pounds  Patient is without complaints other than noted in her HPI. All questions were answered.   All lab and sonogram results have been reviewed. Comments: abnormal: proteinuria 1 + will check 24 hr TP. Pr Cr ratio  Assessment:  1.  Pregnancy at 334w1d,  Estimated Date of Delivery: 02/21/15 :  Gest htn                        2.  proteinuria                        3.    Medication(s) Plans:  none  Treatment Plan:  Twice weekly testing of fetus                            24 hr urine  Follow up in 0.5 weeks for appointment for high risk OB care, nst   This chart was SCRIBED for Christin BachJohn Justiss Gerbino, MD by Ronney LionSuzanne Le, ED Scribe. This patient was seen in room 1 and the patient's care was started at 12:50 PM.   I personally performed the services described in this documentation, which was SCRIBED in my presence. The recorded information has been reviewed and considered accurate. It has been edited as necessary during review. Tilda BurrowFERGUSON,Demaryius Imran V, MD

## 2015-01-18 NOTE — Progress Notes (Signed)
Pt denies any concerns at this time.

## 2015-01-21 ENCOUNTER — Encounter: Payer: 59 | Admitting: Pediatrics

## 2015-01-21 ENCOUNTER — Ambulatory Visit (INDEPENDENT_AMBULATORY_CARE_PROVIDER_SITE_OTHER): Payer: 59 | Admitting: Obstetrics and Gynecology

## 2015-01-21 ENCOUNTER — Encounter: Payer: Self-pay | Admitting: Obstetrics and Gynecology

## 2015-01-21 ENCOUNTER — Other Ambulatory Visit: Payer: Self-pay | Admitting: *Deleted

## 2015-01-21 VITALS — BP 130/90 | HR 80 | Wt 214.0 lb

## 2015-01-21 DIAGNOSIS — O133 Gestational [pregnancy-induced] hypertension without significant proteinuria, third trimester: Secondary | ICD-10-CM | POA: Diagnosis not present

## 2015-01-21 DIAGNOSIS — Z3403 Encounter for supervision of normal first pregnancy, third trimester: Secondary | ICD-10-CM

## 2015-01-21 DIAGNOSIS — Z1389 Encounter for screening for other disorder: Secondary | ICD-10-CM

## 2015-01-21 DIAGNOSIS — Z331 Pregnant state, incidental: Secondary | ICD-10-CM

## 2015-01-21 LAB — POCT URINALYSIS DIPSTICK
Blood, UA: NEGATIVE
Glucose, UA: NEGATIVE
Ketones, UA: NEGATIVE
LEUKOCYTES UA: NEGATIVE
Nitrite, UA: NEGATIVE

## 2015-01-21 NOTE — Progress Notes (Signed)
This encounter was created in error - please disregard.

## 2015-01-22 LAB — PROTEIN, URINE, 24 HOUR
PROTEIN 24H UR: 344.3 mg/(24.h) — AB (ref 30.0–150.0)
PROTEIN UR: 15.3 mg/dL

## 2015-01-24 ENCOUNTER — Ambulatory Visit (INDEPENDENT_AMBULATORY_CARE_PROVIDER_SITE_OTHER): Payer: 59

## 2015-01-24 ENCOUNTER — Other Ambulatory Visit: Payer: Self-pay | Admitting: Obstetrics & Gynecology

## 2015-01-24 ENCOUNTER — Other Ambulatory Visit: Payer: 59 | Admitting: Obstetrics and Gynecology

## 2015-01-24 ENCOUNTER — Ambulatory Visit (INDEPENDENT_AMBULATORY_CARE_PROVIDER_SITE_OTHER): Payer: 59 | Admitting: Obstetrics and Gynecology

## 2015-01-24 ENCOUNTER — Encounter: Payer: Self-pay | Admitting: Obstetrics and Gynecology

## 2015-01-24 VITALS — BP 130/78 | HR 84 | Wt 216.0 lb

## 2015-01-24 DIAGNOSIS — O133 Gestational [pregnancy-induced] hypertension without significant proteinuria, third trimester: Secondary | ICD-10-CM

## 2015-01-24 DIAGNOSIS — O0993 Supervision of high risk pregnancy, unspecified, third trimester: Secondary | ICD-10-CM

## 2015-01-24 DIAGNOSIS — Z1389 Encounter for screening for other disorder: Secondary | ICD-10-CM

## 2015-01-24 DIAGNOSIS — O1223 Gestational edema with proteinuria, third trimester: Secondary | ICD-10-CM

## 2015-01-24 DIAGNOSIS — Z331 Pregnant state, incidental: Secondary | ICD-10-CM

## 2015-01-24 DIAGNOSIS — Z3A36 36 weeks gestation of pregnancy: Secondary | ICD-10-CM | POA: Diagnosis not present

## 2015-01-24 DIAGNOSIS — Z3403 Encounter for supervision of normal first pregnancy, third trimester: Secondary | ICD-10-CM

## 2015-01-24 LAB — POCT URINALYSIS DIPSTICK
Glucose, UA: NEGATIVE
Ketones, UA: NEGATIVE
Leukocytes, UA: NEGATIVE
Nitrite, UA: NEGATIVE
Protein, UA: 1
RBC UA: NEGATIVE

## 2015-01-24 NOTE — Progress Notes (Signed)
Patient ID: Tiffany Bass, female   DOB: 04/12/1986, 29 y.o.   MRN: [id number]  Fetal Surveillance Testing today:  U/S BPP 8/8.   High Risk Pregnancy Diagnosis(es):  GHTN G1P0 5873w0d Estimated Date of Delivery: 02/21/15  Blood pressure 130/78, pulse 84, weight 216 lb (97.977 kg), last menstrual period 05/07/2014.  Urinalysis: Positive for protein (1+)   HPI: The patient is being seen today for ongoing management of GHTN. Today she reports no complaints. She denies using any hypertension medication. And denies headache , scotoma, pain. She's 'felt fine" throughout.   BP weight and urine results all reviewed and noted. Patient reports good fetal movement, denies any bleeding and no rupture of membranes symptoms or regular contractions.  Pr/Cr ratio ordered last week  Fundal Height:  36 cm Fetal Heart rate:  BPP 8/8 fhr 134  Edema:  3+ pedal, reflexes 1+   Patient is without complaints other than noted in her HPI. All questions were answered.  All lab and sonogram results have been reviewed. Comments: BPP  8/8.   Assessment:  1.  Pregnancy at [redacted]w[redacted]d,  Estimated Date of Delivery: 02/21/15 :  GHTN                         2.  R/o Pre-E                        Will recheck  Pr/Cr Ratio  Medication(s) Plans:  none  Treatment Plan:  Twice weekly testing, IOL at 37 wk   Follow up in 0.5 weeks for appointment for high risk OB care. NST bp check

## 2015-01-24 NOTE — Progress Notes (Signed)
US BPP 8/8, cephalic,post fundal pl gr 3 , afi 14cm,fht 134bpm

## 2015-01-24 NOTE — Progress Notes (Signed)
Pt denies any problems or concerns at this time.  

## 2015-01-25 LAB — PROTEIN / CREATININE RATIO, URINE
Creatinine, Urine: 112.9 mg/dL
PROTEIN UR: 33.6 mg/dL
PROTEIN/CREAT RATIO: 298 mg/g{creat} — AB (ref 0–200)

## 2015-01-28 ENCOUNTER — Other Ambulatory Visit (INDEPENDENT_AMBULATORY_CARE_PROVIDER_SITE_OTHER): Payer: 59

## 2015-01-28 ENCOUNTER — Encounter: Payer: Self-pay | Admitting: Obstetrics and Gynecology

## 2015-01-28 ENCOUNTER — Other Ambulatory Visit: Payer: Self-pay | Admitting: Obstetrics and Gynecology

## 2015-01-28 ENCOUNTER — Ambulatory Visit (INDEPENDENT_AMBULATORY_CARE_PROVIDER_SITE_OTHER): Payer: 59 | Admitting: Obstetrics and Gynecology

## 2015-01-28 VITALS — BP 118/70 | HR 96 | Wt 213.5 lb

## 2015-01-28 DIAGNOSIS — O4103X1 Oligohydramnios, third trimester, fetus 1: Secondary | ICD-10-CM | POA: Diagnosis not present

## 2015-01-28 DIAGNOSIS — Z369 Encounter for antenatal screening, unspecified: Secondary | ICD-10-CM

## 2015-01-28 DIAGNOSIS — Z331 Pregnant state, incidental: Secondary | ICD-10-CM

## 2015-01-28 DIAGNOSIS — Z3403 Encounter for supervision of normal first pregnancy, third trimester: Secondary | ICD-10-CM

## 2015-01-28 DIAGNOSIS — O133 Gestational [pregnancy-induced] hypertension without significant proteinuria, third trimester: Secondary | ICD-10-CM

## 2015-01-28 DIAGNOSIS — Z1389 Encounter for screening for other disorder: Secondary | ICD-10-CM

## 2015-01-28 LAB — OB RESULTS CONSOLE GC/CHLAMYDIA
CHLAMYDIA, DNA PROBE: NEGATIVE
GC PROBE AMP, GENITAL: NEGATIVE

## 2015-01-28 LAB — POCT URINALYSIS DIPSTICK
Blood, UA: NEGATIVE
GLUCOSE UA: NEGATIVE
KETONES UA: NEGATIVE
LEUKOCYTES UA: NEGATIVE
NITRITE UA: NEGATIVE
Protein, UA: 1

## 2015-01-28 LAB — OB RESULTS CONSOLE GBS: GBS: POSITIVE

## 2015-01-28 NOTE — Progress Notes (Signed)
US [redacted]W[redacted]D,post fundal pl gr 3,afi 11.5cm,cephalic

## 2015-01-28 NOTE — Progress Notes (Signed)
Pt denies any problems or concerns at this time.  

## 2015-01-29 ENCOUNTER — Telehealth (HOSPITAL_COMMUNITY): Payer: Self-pay | Admitting: *Deleted

## 2015-01-29 NOTE — Telephone Encounter (Signed)
Preadmission screen  

## 2015-01-29 NOTE — Progress Notes (Signed)
High Risk Pregnancy Diagnosis(es)  Mild PreE , late preterm pregnancy     Blood pressure 118/70, pulse 96, weight 213 lb 8 oz (96.843 kg), last menstrual period 05/07/2014.    G1P0 7184w5d Estimated Date of Delivery: 02/21/15     Pt being followed for Gest Htn since BP's since BP's over 90 diastolic obtained at 2 consecutive visits in 34-35 wk range. Pt then developed proteinuria 1+ , and at last wk's visit 24 hour urine collected with 24 hr protein of 314 mg/day, making Dx mild PreEclampsia. Interestingly , bp has reduced. Pt deniew headache, or scotoma. Pt does note that the baby has "dropped" and feels smaller. Fetal movement considered normal.      BP weight and urine results reviewed and notable for 1+ proteinuria.. Patient reports    good fetal movement, denies any bleeding and no rupture of membranes symptoms or regular contractions .  Fundal Height:  34-35, less than last wk. Fetal Heart rate:  140's. Edema:  1+ pretibial Urinalysis: Positive 1+ protein. Cervix: 1 midposition,20% -2 vertex .Assessment:4184w5d,   Gest Hypertension, now meets PreE criteria since proteinuria has developed.  Plan Lengthy discussion of pros and cons of IOL at 37wk for mild preeclampsia, in the face of favorable blood pressures at this time. Some consideration given to continued biweekly monitoring and delay of IOL attempting to get to 39 wk , but given that baby has definitely dropped since last visit, and cervix is changing, will proceed with IOL.  Pt made aware of the alternatives of care , and my rationale for proceeding with IOL, and accepts recommended IOL. Questions answered.  IOL scheduled for 37 wk.  Medication(s) Plans:  none  Treatment Plan:        iol at 37 wk for PreEclampsia mild Follow up:           2/7 weeks for  Admit to L& D All questions were answered.

## 2015-01-30 ENCOUNTER — Telehealth: Payer: Self-pay | Admitting: Women's Health

## 2015-01-30 LAB — GC/CHLAMYDIA PROBE AMP
Chlamydia trachomatis, NAA: NEGATIVE
Neisseria gonorrhoeae by PCR: NEGATIVE

## 2015-01-30 LAB — STREP GP B NAA: Strep Gp B NAA: POSITIVE — AB

## 2015-01-30 NOTE — Telephone Encounter (Signed)
Pt states she will be 37 wks tomorrow and was here for evaluation on Monday with Dr. Emelda FearFerguson and had her cervix checked and she is scheduled to be induced tomorrow, this morning when she got up she had a little bit of a thick brownish d/c when she wiped.  Informed pt that it could be from having her cervix checked on Monday or could starting to lose her mucus plug, encouraged her to keep an eye on things and if had a sudden gush of fluids or contractions get consistent and more intense and are 10 - 15 minutes apart [facility] otherwise be there in the morning at her scheduled time.  Pt verbalized understanding.

## 2015-01-31 ENCOUNTER — Encounter (HOSPITAL_COMMUNITY): Payer: Self-pay

## 2015-01-31 ENCOUNTER — Inpatient Hospital Stay (HOSPITAL_COMMUNITY)
Admission: RE | Admit: 2015-01-31 | Discharge: 2015-02-03 | DRG: 774 | Disposition: A | Discharge status: Home / Self Care | Payer: 59 | Source: Ambulatory Visit | Attending: Obstetrics & Gynecology | Admitting: Obstetrics & Gynecology

## 2015-01-31 VITALS — BP 128/83 | HR 115 | Temp 98.3°F | Resp 18 | Ht 61.0 in | Wt 213.0 lb

## 2015-01-31 DIAGNOSIS — O99824 Streptococcus B carrier state complicating childbirth: Secondary | ICD-10-CM | POA: Diagnosis present

## 2015-01-31 DIAGNOSIS — Z833 Family history of diabetes mellitus: Secondary | ICD-10-CM

## 2015-01-31 DIAGNOSIS — O1403 Mild to moderate pre-eclampsia, third trimester: Secondary | ICD-10-CM | POA: Diagnosis present

## 2015-01-31 DIAGNOSIS — Z3A37 37 weeks gestation of pregnancy: Secondary | ICD-10-CM | POA: Diagnosis present

## 2015-01-31 DIAGNOSIS — Z8249 Family history of ischemic heart disease and other diseases of the circulatory system: Secondary | ICD-10-CM | POA: Diagnosis not present

## 2015-01-31 DIAGNOSIS — Z6841 Body Mass Index (BMI) 40.0 and over, adult: Secondary | ICD-10-CM | POA: Diagnosis not present

## 2015-01-31 DIAGNOSIS — O99214 Obesity complicating childbirth: Secondary | ICD-10-CM | POA: Diagnosis present

## 2015-01-31 DIAGNOSIS — Z3403 Encounter for supervision of normal first pregnancy, third trimester: Secondary | ICD-10-CM

## 2015-01-31 DIAGNOSIS — Z3490 Encounter for supervision of normal pregnancy, unspecified, unspecified trimester: Secondary | ICD-10-CM

## 2015-01-31 HISTORY — DX: Other specified health status: Z78.9

## 2015-01-31 LAB — COMPREHENSIVE METABOLIC PANEL
ALBUMIN: 2.8 g/dL — AB (ref 3.5–5.0)
ALT: 13 U/L — AB (ref 14–54)
AST: 18 U/L (ref 15–41)
Alkaline Phosphatase: 128 U/L — ABNORMAL HIGH (ref 38–126)
Anion gap: 7 (ref 5–15)
BUN: 15 mg/dL (ref 6–20)
CO2: 20 mmol/L — ABNORMAL LOW (ref 22–32)
Calcium: 8.1 mg/dL — ABNORMAL LOW (ref 8.9–10.3)
Chloride: 108 mmol/L (ref 101–111)
Creatinine, Ser: 0.46 mg/dL (ref 0.44–1.00)
GFR calc Af Amer: >60 mL/min (ref >60)
GFR calc non Af Amer: >60 mL/min (ref >60)
Glucose, Bld: 85 mg/dL (ref 65–99)
Potassium: 4 mmol/L (ref 3.5–5.1)
Sodium: 135 mmol/L (ref 135–145)
TOTAL PROTEIN: 5.9 g/dL — AB (ref 6.5–8.1)
Total Bilirubin: 0.2 mg/dL — ABNORMAL LOW (ref 0.3–1.2)

## 2015-01-31 LAB — CBC
HCT: 36 % (ref 36.0–46.0)
Hemoglobin: 12.3 g/dL (ref 12.0–15.0)
MCH: 29.1 pg (ref 26.0–34.0)
MCHC: 34.2 g/dL (ref 30.0–36.0)
MCV: 85.1 fL (ref 78.0–100.0)
Platelets: 126 10*3/uL — ABNORMAL LOW (ref 150–400)
RBC: 4.23 MIL/uL (ref 3.87–5.11)
RDW: 14.2 % (ref 11.5–15.5)
WBC: 8.6 10*3/uL (ref 4.0–10.5)

## 2015-01-31 LAB — ABO/RH: ABO/RH(D): O POS

## 2015-01-31 LAB — TYPE AND SCREEN
ABO/RH(D): O POS
ANTIBODY SCREEN: NEGATIVE

## 2015-01-31 LAB — US OB FOLLOW UP

## 2015-01-31 LAB — RPR: RPR Ser Ql: NONREACTIVE

## 2015-01-31 MED ORDER — CITRIC ACID-SODIUM CITRATE 334-500 MG/5ML PO SOLN: 30.0000 mL | ORAL | Status: DC | PRN

## 2015-01-31 MED ORDER — TERBUTALINE SULFATE 1 MG/ML IJ SOLN: 0.2500 mg | Freq: Once | INTRAMUSCULAR | Status: AC | PRN

## 2015-01-31 MED ORDER — FENTANYL CITRATE (PF) 100 MCG/2ML IJ SOLN
50.0000 ug | INTRAMUSCULAR | Status: DC | PRN
  Administered 2015-01-31: 50 ug via INTRAVENOUS
  Administered 2015-01-31: 100 ug via INTRAVENOUS
  Filled 2015-01-31 (×2): qty 2

## 2015-01-31 MED ORDER — MISOPROSTOL 25 MCG QUARTER TABLET
25.0000 ug | ORAL_TABLET | ORAL | Status: DC
  Filled 2015-01-31: qty 0.25

## 2015-01-31 MED ORDER — MISOPROSTOL 50MCG HALF TABLET
50.0000 ug | ORAL_TABLET | ORAL | Status: DC | PRN
  Administered 2015-01-31 (×2): 50 ug via ORAL
  Filled 2015-01-31 (×2): qty 0.5

## 2015-01-31 MED ORDER — OXYCODONE-ACETAMINOPHEN 5-325 MG PO TABS: 1.0000 | ORAL_TABLET | ORAL | Status: DC | PRN

## 2015-01-31 MED ORDER — ONDANSETRON HCL 4 MG/2ML IJ SOLN: 4.0000 mg | Freq: Four times a day (QID) | INTRAMUSCULAR | Status: DC | PRN

## 2015-01-31 MED ORDER — LACTATED RINGERS IV SOLN
INTRAVENOUS | Status: DC
  Administered 2015-02-01: 07:00:00 via INTRAVENOUS

## 2015-01-31 MED ORDER — PENICILLIN G POTASSIUM 5000000 UNITS IJ SOLR
2.5000 10*6.[IU] | INTRAVENOUS | Status: DC
  Administered 2015-02-01: 2.5 10*6.[IU] via INTRAVENOUS
  Filled 2015-01-31 (×7): qty 2.5

## 2015-01-31 MED ORDER — MISOPROSTOL 50MCG HALF TABLET
50.0000 ug | ORAL_TABLET | ORAL | Status: DC
  Filled 2015-01-31: qty 0.5

## 2015-01-31 MED ORDER — PENICILLIN G POTASSIUM 5000000 UNITS IJ SOLR
5.0000 10*6.[IU] | Freq: Once | INTRAVENOUS | Status: AC
  Administered 2015-02-01: 5 10*6.[IU] via INTRAVENOUS
  Filled 2015-01-31: qty 5

## 2015-01-31 MED ORDER — OXYTOCIN 40 UNITS IN LACTATED RINGERS INFUSION - SIMPLE MED: 62.5000 mL/h | INTRAVENOUS | Status: DC

## 2015-01-31 MED ORDER — MISOPROSTOL 25 MCG QUARTER TABLET: 25.0000 ug | ORAL_TABLET | ORAL | Status: DC | PRN

## 2015-01-31 MED ORDER — ZOLPIDEM TARTRATE 5 MG PO TABS
5.0000 mg | ORAL_TABLET | Freq: Every evening | ORAL | Status: DC | PRN
  Administered 2015-01-31: 5 mg via ORAL
  Filled 2015-01-31: qty 1

## 2015-01-31 MED ORDER — LIDOCAINE HCL (PF) 1 % IJ SOLN
30.0000 mL | INTRAMUSCULAR | Status: AC | PRN
  Administered 2015-02-01: 30 mL via SUBCUTANEOUS
  Filled 2015-01-31: qty 30

## 2015-01-31 MED ORDER — OXYTOCIN BOLUS FROM INFUSION
500.0000 mL | INTRAVENOUS | Status: DC
  Administered 2015-02-01: 500 mL via INTRAVENOUS

## 2015-01-31 MED ORDER — LACTATED RINGERS IV SOLN
500.0000 mL | INTRAVENOUS | Status: DC | PRN
  Administered 2015-02-01: 500 mL via INTRAVENOUS

## 2015-01-31 MED ORDER — ACETAMINOPHEN 325 MG PO TABS: 650.0000 mg | ORAL_TABLET | ORAL | Status: DC | PRN

## 2015-01-31 MED ORDER — OXYCODONE-ACETAMINOPHEN 5-325 MG PO TABS: 2.0000 | ORAL_TABLET | ORAL | Status: DC | PRN

## 2015-01-31 MED ORDER — OXYTOCIN 40 UNITS IN LACTATED RINGERS INFUSION - SIMPLE MED
1.0000 m[IU]/min | INTRAVENOUS | Status: DC
  Administered 2015-02-01: 2 m[IU]/min via INTRAVENOUS
  Filled 2015-01-31: qty 1000

## 2015-01-31 NOTE — H&P (Signed)
LABOR ADMISSION HISTORY AND PHYSICAL  Tiffany Bass is a 29 y.o. female G1P0 with IUP at 2732w0d by 6 wk u/s presenting for IOL 2/2 mild preeclampsia. She reports +FMs, No LOF, no VB, no blurry vision, headaches or peripheral edema, and RUQ pain.  She plans on breastfeeding. She desires to do condoms for birth control. Denies HA, scotoma, RUQ pain, nondependent edema (swelling in head and neck), SOB, oliguria/decrease in urination. [facility]. Pt does not desire epidural.  Dating: By u/s --->  Estimated Date of Delivery: 02/21/15  Sono:  @[redacted]w[redacted]d , CWD, normal anatomy, variable presentation, 342g   Prenatal History/Complications: Prenatal care with Dr. Mauricio Bass starting at wk 4 gestation. No other complications in prenatal period. First recording of hypertension on 01/14/15. 1+ protunuria starting on 01/18/15. Declined genetic screening. Negative for GC/Gonorrhea. Had flu vaccination. Got Tdap. 2hr glucose screen normal. GPS positive  Amniotic fluid: 11.5cm this Monday. 1+ proteinuria on 01/28/15.  Previous ObHx: G1P0 This is her first pregnancy. Previous gyn hx: denies ovarian cysts, fibroids, STDs, abnormal paps Previous med Hx: gallstone with cholecystectomy (Dec 2011) Past Medical History: Allergies: NKDA (including PCN, latex, or shellfish) Fx: Denies congenital anomalies, genetic problems, twins. Social Hx: Per Epic.  Past Medical History  Diagnosis Date  . Pregnant 06/19/2014  . Supervision of normal pregnancy in first trimester 06/29/2014     [facility]  Tiffany HiresChris  Bass 29 yo wm 1st Dating By US Pap 10/06/12 GC/CT Initial:                36+wks: Genetic Screen NT/IT:  CF screen  Anatomic US  Flu vaccine  Tdap Recommended ~ 28wks Glucose Screen  2 hr GBS  Feed Preference  Contraception  Circumcision  Childbirth Classes  Pediatrician    . Medical history  non-contributory     Past Surgical History: Past Surgical History  Procedure Laterality Date  . Cholecystectomy      Obstetrical History: OB History    Gravida Para Term Preterm AB TAB SAB Ectopic Multiple Living   1               Social History: History   Social History  . Marital Status: Married    Spouse Name: N/A  . Number of Children: N/A  . Years of Education: N/A   Social History Main Topics  . Smoking status: Never Smoker   . Smokeless tobacco: Never Used  . Alcohol Use: No  . Drug Use: No  . Sexual Activity: Yes    Birth Control/ Protection: None   Other Topics Concern  . None   Social History Narrative    Family History: Family History  Problem Relation Age of Onset  . Diabetes Paternal Grandmother   . COPD Father   . Asthma Father   . Hypertension Mother   . Emphysema Paternal Grandfather     Allergies: No Known Allergies  Prescriptions prior to admission  Medication Sig Dispense Refill Last Dose  . Pediatric Multiple Vit-C-FA (FLINSTONES GUMMIES OMEGA-3 DHA) CHEW Chew 1 tablet by mouth daily. Take 2 daily.   Taking     Review of Systems   All systems reviewed and negative except as stated in HPI  Blood pressure 109/75, pulse 94, temperature 98.2 F (36.8 C), temperature source Oral, resp. rate 18, height 5\' 1"  (1.549 m), weight 96.616 kg (213 lb), last menstrual period 05/07/2014.  General appearance: alert, cooperative and no distress Lungs: clear to auscultation bilaterally Heart: regular rate and rhythm Abdomen: soft, non-tender; bowel sounds normal Pelvic: Posterior uterus. Vertex presentation. 1/50/-3 Extremities: no sign of DVT DTR's 2+ Presentation: cephalic Fetal monitoringBaseline: 130 bpm, Variability: Good {> 6 bpm), Accelerations: Reactive and Decelerations: Absent Uterine activityNone     Prenatal labs: ABO, Rh: O/POS/-- (10/16 1020) Antibody: Negative (03/21 0901) Rubella:   RPR: Non Reactive (03/21 0901)   HBsAg: NEGATIVE (10/16 1020)  HIV: NONREACTIVE (10/16 1020)  GBS:   positive 1 hr Glucola normal Genetic screening  declined Anatomy US: variable presentation, wnl  Prenatal Transfer Tool  Maternal Diabetes: No Genetic Screening: Declined Maternal Ultrasounds/Referrals: Normal Fetal Ultrasounds or other Referrals:  None Maternal Substance Abuse:  No Significant Maternal Medications:  None Significant Maternal Lab Results: None  Results for orders placed or performed during the hospital encounter of 01/31/15 (from the past 24 hour(s))  CBC   Collection Time: 01/31/15  8:00 AM  Result Value Ref Range   WBC 8.6 4.0 - 10.5 K/uL   RBC 4.23 3.87 - 5.11 MIL/uL   Hemoglobin 12.3 12.0 - 15.0 g/dL   HCT 96.036.0 45.436.0 - 09.846.0 %   MCV 85.1 78.0 - 100.0 fL   MCH 29.1 26.0 - 34.0 pg   MCHC 34.2 30.0 - 36.0 g/dL   RDW 11.914.2 14.711.5 - 82.915.5 %   Platelets 126 (L) 150 - 400 K/uL    Patient Active Problem List   Diagnosis Date Noted  . Pregnancy 01/31/2015  . Gestational hypertension 01/13/2015  . Supervision of normal first pregnancy 06/29/2014    Assessment: Tiffany Bass is a 29 y.o. G1P0 at 3068w0d here for IOL 2/2 preeclampsia with no features. Given her mild features of pre-E with normal BP this AM, we will not initiate labetalol/hydralazine/magnesium sulfate. Will induce labor with PO cytotec and foley bulb.  # Pre-eclampsia - diagnosed by mild range BP in clinic and 24 hour Urine protein of 314. Upon arrival to L&D, BP normal range and no s/sx pre-eclampsia - will get HELLP labs and CTM - no indication for magnesium as no severe features currently  #Labor: PO 50mcg cytotec, reassess after 4 hours to determine if FB able to be placed  #Pain: Declines epidural. #FWB:  Cat I, reassuring #ID:  GBS+. Will start IV Penicillin as Pt progresses through labor. #MOF: Breastfeeding #MOC: condoms #Circ:  Yes [facility] (Scheduled).

## 2015-01-31 NOTE — Progress Notes (Signed)
Tiffany Bass is a 29 y.o. G1P0 at 5613w0d by ultrasound admitted for induction of labor due to mild pre-eclampsia.  Subjective: Contractions are spaced out. Pain is well-tolerated (still does not desire epidural). Discussed IV pain medications with Pt. No PIH symptoms.   Objective: BP 143/92 mmHg  Pulse 85  Temp(Src) 98.3 F (36.8 C) (Oral)  Resp 20  Ht 5\' 1"  (1.549 m)  Wt 96.616 kg (213 lb)  BMI 40.27 kg/m2  SpO2 97%  LMP 05/07/2014      FHT:  FHR: 140 bpm, variability: moderate,  accelerations:  Present,  decelerations:  Absent UC:   none SVE:   Dilation: 1.5 Effacement (%): 60 Station: -3 Exam by:: Woody Sellerhelsea Dean, RN  Labs: Lab Results  Component Value Date   WBC 8.6 01/31/2015   HGB 12.3 01/31/2015   HCT 36.0 01/31/2015   MCV 85.1 01/31/2015   PLT 126* 01/31/2015    Assessment / Plan: Induction of labor due to mild pre-eclampsia. Progressing on cytotec. Discussed with patient about re-dosing cytotec and then place FB during night shift.  Labor: Progressing normally, will give another dose of Cytotec (will be s/p 3 doses) and re-evaluate Preeclampsia:  no signs or symptoms of toxicity and labs stable Fetal Wellbeing:  Category I Pain Control:  No epidural. Start IV fetanyl PRN. I/D:  GBS positive will start IV PCN now Anticipated MOD:  SVD. Expectant management.  Forest Beckerunice Yim 01/31/2015, 7:05 PM  RESIDENT ADDENDUM I have separately seen and examined the patient. I have discussed the findings and exam with the medical student and agree with the above note. I helped develop the management plan that is described in the student's note, and I agree with the content.  Any changes were added to above note.   Caryl AdaJazma Phelps, DO 01/31/2015, 7:50 PM

## 2015-01-31 NOTE — Progress Notes (Signed)
At 1356, questionable prolonged decel. Pt reports that she was changing positions at that time. Readjusted FM and Toco. Will cont to monitor closely.

## 2015-01-31 NOTE — Progress Notes (Signed)
   Mare LoanJessica M Malhotra is a 29 y.o. G1P0 at 6741w0d  admitted for induction of labor due to Hypertension.  Subjective: No c/o  Objective: Filed Vitals:   01/31/15 2130 01/31/15 2135 01/31/15 2150 01/31/15 2154  BP:    147/79  Pulse: 95 111 93 106  Temp:      TempSrc:      Resp:      Height:      Weight:      SpO2: 94% 98% 100%       FHT:  FHR: 135 bpm, variability: moderate,  accelerations:  Present,  decelerations:  Absent UC:   none SVE:   Dilation: 1.5 Effacement (%): 80 Station: -2 Exam by:: Fran,CNM  Foley inserted into cervix and inflated with 60cc H20.   Labs: Lab Results  Component Value Date   WBC 8.6 01/31/2015   HGB 12.3 01/31/2015   HCT 36.0 01/31/2015   MCV 85.1 01/31/2015   PLT 126* 01/31/2015    Assessment / Plan: IOL for mild preeclampsia, ripening phase Plan pitocin when foley falls out Labor: no Fetal Wellbeing:  Category I Pain Control:  Labor support without medications Anticipated MOD:  NSVD  CRESENZO-DISHMAN,[place] 01/31/2015, 10:54 PM

## 2015-01-31 NOTE — Progress Notes (Signed)
Labor Progress Note  ASSESSMENT:   Tiffany LoanJessica M Bass 29 y.o. G1P0 at 10162w0d in induced labor   PLAN:  1) Labor curve reviewed.       Progress: Latent phase, progressing appropriately             Plan: S/p cytotec 50 mcg PO x1, will re-dose now given 1/50%/-3 and very posterior cervix  2) Fetal heart tracing reviewed.    Cat 1, reassuring. No intervention needed  3) GBS Status - Positive, start pitocin when in active labor  4) Pre-eclampsia - Diagnosed in clinic by mild range BP and 24 hour urine protein of 314. Upon arrival to L&D pressures have been normal range with no s/sx pre-eclampsia. Denies HA, Scotoma, RUQ pain.  4) Other Problems Active Problems:   Pregnancy   SUBJECTIVE:  Feels mild contractions, but not uncomfortable. Denies HA, scotoma, RUQ pain, SOB.   OBJECTIVE:  Vital Signs: Patient Vitals for the past 2 hrs:  BP Temp Temp src Pulse Resp SpO2  01/31/15 1521 124/64 mmHg 98.3 F (36.8 C) Oral 93 18 -  01/31/15 1405 - - - 95 - 97 %   SVE: Dilation: 1, Effacement (%): 50, Station: -3  FHR Monitoring Baseline Rate (A): 135 bpm / mod variability /+ accels, no decels   Accelerations: 15 x 15 Contraction Frequency (min): irregular uc's

## 2015-02-01 ENCOUNTER — Inpatient Hospital Stay (HOSPITAL_COMMUNITY): Payer: 59 | Admitting: Anesthesiology

## 2015-02-01 ENCOUNTER — Encounter (HOSPITAL_COMMUNITY): Payer: Self-pay

## 2015-02-01 DIAGNOSIS — Z6841 Body Mass Index (BMI) 40.0 and over, adult: Secondary | ICD-10-CM

## 2015-02-01 DIAGNOSIS — O99824 Streptococcus B carrier state complicating childbirth: Secondary | ICD-10-CM

## 2015-02-01 DIAGNOSIS — O1403 Mild to moderate pre-eclampsia, third trimester: Secondary | ICD-10-CM

## 2015-02-01 LAB — CBC
HCT: 38.1 % (ref 36.0–46.0)
HCT: 34.9 % — ABNORMAL LOW (ref 36.0–46.0)
Hemoglobin: 12.9 g/dL (ref 12.0–15.0)
Hemoglobin: 11.7 g/dL — ABNORMAL LOW (ref 12.0–15.0)
MCH: 29.3 pg (ref 26.0–34.0)
MCH: 28.9 pg (ref 26.0–34.0)
MCHC: 33.9 g/dL (ref 30.0–36.0)
MCHC: 33.5 g/dL (ref 30.0–36.0)
MCV: 86.4 fL (ref 78.0–100.0)
MCV: 86.2 fL (ref 78.0–100.0)
PLATELETS: 131 10*3/uL — AB (ref 150–400)
Platelets: 134 10*3/uL — ABNORMAL LOW (ref 150–400)
RBC: 4.41 MIL/uL (ref 3.87–5.11)
RBC: 4.05 MIL/uL (ref 3.87–5.11)
RDW: 14.2 % (ref 11.5–15.5)
RDW: 14.2 % (ref 11.5–15.5)
WBC: 12.3 10*3/uL — ABNORMAL HIGH (ref 4.0–10.5)
WBC: 15.9 10*3/uL — ABNORMAL HIGH (ref 4.0–10.5)

## 2015-02-01 MED ORDER — OXYCODONE-ACETAMINOPHEN 5-325 MG PO TABS: 1.0000 | ORAL_TABLET | ORAL | Status: DC | PRN

## 2015-02-01 MED ORDER — DIPHENHYDRAMINE HCL 25 MG PO CAPS: 25.0000 mg | ORAL_CAPSULE | Freq: Four times a day (QID) | ORAL | Status: DC | PRN

## 2015-02-01 MED ORDER — TETANUS-DIPHTH-ACELL PERTUSSIS 5-2.5-18.5 LF-MCG/0.5 IM SUSP: 0.5000 mL | Freq: Once | INTRAMUSCULAR | Status: DC

## 2015-02-01 MED ORDER — LANOLIN HYDROUS EX OINT: TOPICAL_OINTMENT | CUTANEOUS | Status: DC | PRN

## 2015-02-01 MED ORDER — IBUPROFEN 600 MG PO TABS
600.0000 mg | ORAL_TABLET | Freq: Four times a day (QID) | ORAL | Status: DC
  Administered 2015-02-01 – 2015-02-03 (×6): 600 mg via ORAL
  Filled 2015-02-01 (×8): qty 1

## 2015-02-01 MED ORDER — TERBUTALINE SULFATE 1 MG/ML IJ SOLN
INTRAMUSCULAR | Status: AC
  Filled 2015-02-01: qty 1

## 2015-02-01 MED ORDER — DIBUCAINE 1 % RE OINT: 1.0000 "application " | TOPICAL_OINTMENT | RECTAL | Status: DC | PRN

## 2015-02-01 MED ORDER — SIMETHICONE 80 MG PO CHEW: 80.0000 mg | CHEWABLE_TABLET | ORAL | Status: DC | PRN

## 2015-02-01 MED ORDER — ZOLPIDEM TARTRATE 5 MG PO TABS: 5.0000 mg | ORAL_TABLET | Freq: Every evening | ORAL | Status: DC | PRN

## 2015-02-01 MED ORDER — EPHEDRINE 5 MG/ML INJ
10.0000 mg | INTRAVENOUS | Status: DC | PRN
  Filled 2015-02-01: qty 2

## 2015-02-01 MED ORDER — OXYCODONE-ACETAMINOPHEN 5-325 MG PO TABS: 2.0000 | ORAL_TABLET | ORAL | Status: DC | PRN

## 2015-02-01 MED ORDER — ACETAMINOPHEN 325 MG PO TABS: 650.0000 mg | ORAL_TABLET | ORAL | Status: DC | PRN

## 2015-02-01 MED ORDER — FENTANYL 2.5 MCG/ML BUPIVACAINE 1/10 % EPIDURAL INFUSION (WH - ANES)
14.0000 mL/h | INTRAMUSCULAR | Status: DC | PRN
  Administered 2015-02-01: 12 mL/h via EPIDURAL
  Administered 2015-02-01: 14 mL/h via EPIDURAL
  Filled 2015-02-01: qty 125

## 2015-02-01 MED ORDER — WITCH HAZEL-GLYCERIN EX PADS: 1.0000 "application " | MEDICATED_PAD | CUTANEOUS | Status: DC | PRN

## 2015-02-01 MED ORDER — PRENATAL MULTIVITAMIN CH
1.0000 | ORAL_TABLET | Freq: Every day | ORAL | Status: DC
  Administered 2015-02-02 – 2015-02-03 (×2): 1 via ORAL
  Filled 2015-02-01 (×2): qty 1

## 2015-02-01 MED ORDER — PHENYLEPHRINE 40 MCG/ML (10ML) SYRINGE FOR IV PUSH (FOR BLOOD PRESSURE SUPPORT)
80.0000 ug | PREFILLED_SYRINGE | INTRAVENOUS | Status: DC | PRN
  Filled 2015-02-01: qty 20
  Filled 2015-02-01: qty 2

## 2015-02-01 MED ORDER — BUPIVACAINE HCL (PF) 0.25 % IJ SOLN
INTRAMUSCULAR | Status: DC | PRN
  Administered 2015-02-01 (×2): 4 mL

## 2015-02-01 MED ORDER — ONDANSETRON HCL 4 MG/2ML IJ SOLN: 4.0000 mg | INTRAMUSCULAR | Status: DC | PRN

## 2015-02-01 MED ORDER — DIPHENHYDRAMINE HCL 50 MG/ML IJ SOLN: 12.5000 mg | INTRAMUSCULAR | Status: DC | PRN

## 2015-02-01 MED ORDER — BENZOCAINE-MENTHOL 20-0.5 % EX AERO: 1.0000 "application " | INHALATION_SPRAY | CUTANEOUS | Status: DC | PRN

## 2015-02-01 MED ORDER — LIDOCAINE-EPINEPHRINE (PF) 2 %-1:200000 IJ SOLN
INTRAMUSCULAR | Status: DC | PRN
  Administered 2015-02-01: 3 mL

## 2015-02-01 MED ORDER — ONDANSETRON HCL 4 MG PO TABS: 4.0000 mg | ORAL_TABLET | ORAL | Status: DC | PRN

## 2015-02-01 MED ORDER — SENNOSIDES-DOCUSATE SODIUM 8.6-50 MG PO TABS
2.0000 | ORAL_TABLET | ORAL | Status: DC
  Administered 2015-02-02: 2 via ORAL
  Filled 2015-02-01: qty 2

## 2015-02-01 NOTE — Progress Notes (Signed)
Mare LoanJessica M Offield is a 29 y.o. G1P0 at 421w1d, admitted for IOL for mild Pre-E  Subjective: FB remains in place. Resting comfortably. Received one dose of pain medicine overnight. Currently has been up for about 1 hr with some inc cramping, last 30 min has felt contractions q 10 min but not regular.  Objective: BP 116/51 mmHg  Pulse 99  Temp(Src) 98.7 F (37.1 C) (Oral)  Resp 16  Ht 5\' 1"  (1.549 m)  Wt 96.616 kg (213 lb)  BMI 40.27 kg/m2  SpO2 100%  LMP 05/07/2014  Fetal Heart Rate: 140-145 Variability: moderate Accelerations: present, 15x15 Decelerations: none  Contractions: no regular UCs  - No re-check with FB in place at this time  Last SVE:   Dilation: 1.5 Effacement (%): 80 Station: -2 Exam by:: Fran,CNM  Pitocin: not started yet Mag: no   Assessment / Plan: 29 y.o. G1P0 at 241w1d admitted for IOL for mild pre-E  Labor: No active labor. FB in place. Continue ripening. Plan for Pitocin when FB falls out. Preeclampsia:  BP currently stable. No new symptoms. Fetal Wellbeing: Cat 1 Pain Control:  Fentanyl IV PRN  Saralyn PilarAlexander Autymn Omlor, DO [facility], PGY-2 02/01/2015, 5:27 AM

## 2015-02-01 NOTE — Lactation Note (Signed)
This note was copied from the chart of Tiffany Virgel PalingJessica Sugrue. Lactation Consultation Note  Patient Name: Tiffany Virgel PalingJessica Charlot ONGEX'BToday's Date: 02/01/2015 Reason for consult: Initial assessment;Infant < 6lbs;Other (Comment) (early term infant , low blood sugar )  This LC was called due to baby having a low blood sugar in L/D and Difficult latch.  Per Alfonzo FellerHeather Gaither Adm RN - baby has had Glucose gel x1 and was asked to try latching baby.  Dr. Ezequiel EssexGable in the room , mom holding baby skin to skin, and Dr. Ezequiel EssexGable checking suck with gloved finger. LC 1st assessed breast tissue and noted white dry coating on the nipple. ( per mom has been there prior to pregnancy)  And the areola and portion nipple pinky red. Left nipple just pinky red. Mom denies having yeast infection or history. Per mom my nipples are extremely sensitive to touch and stimulation. LC asked moms permission to hand express and  LC hand expressed gently and mom was C/O of discomfort on both areolas . Left areola was less sensitive compared to right ,  and 3 large drops of colostrum expressed. Finger fed colostrum and per Dr. Ezequiel EssexGable verbal order to give Alimenetum formula. LC able to finger feed with a dropper next to finger , giving drop by drop at a time. Baby took 2 ml well, the 3rd ml spit up. Baby skin to skin with mom the hole  Time. Due to moms large nipples , and sensitivity , feel it is best mom pumps every 2-3 hours with her DEBP Medela ( which LC set up for her and described how to use it.) also hand express before  pumping and after. Demo to mom at consult.  Also to use olive oil on the nipples and areolas prior to each pumping to decrease friction. Also gradually increase the suction , Start with the #24 Flange and if uncomfortable to increase to #27  Provided by LC. Set up , cleaning of pump pieces, soap and basin provided by LC .  Lactation Impression: Feel this all is going to take some time with this mom due to her sensitivity of  breast tissue.   Mom expressed feelings of really wanting to    Maternal Data    Feeding Feeding Type: Formula Length of feed: 0 min  LATCH Score/Interventions Latch:  (did not attempt latch due to senstive painful nipples and areolas and large nipples )                    Lactation Tools Discussed/Used Tools: Flanges;Pump Flange Size: 27 Breast pump type: Other (comment) (momm has her own DEBP , LC set up ) Pump Review: Setup, frequency, and cleaning   Consult Status Consult Status: Follow-up Date: 02/01/15 Follow-up type: In-patient    Kathrin Greathouseorio, Karmella Bouvier Ann 02/01/2015, 7:06 PM

## 2015-02-01 NOTE — Anesthesia Preprocedure Evaluation (Signed)
Anesthesia Evaluation  Patient identified by MRN, date of birth, ID band Patient awake    Reviewed: Allergy & Precautions, NPO status , Patient's Chart, lab work & pertinent test results  History of Anesthesia Complications Negative for: history of anesthetic complications  Airway Mallampati: III  TM Distance: >3 FB Neck ROM: Full    Dental  (+) Teeth Intact   Pulmonary neg pulmonary ROS,  breath sounds clear to auscultation        Cardiovascular hypertension, Rhythm:Regular     Neuro/Psych negative neurological ROS  negative psych ROS   GI/Hepatic negative GI ROS, Neg liver ROS,   Endo/Other  Morbid obesity  Renal/GU negative Renal ROS     Musculoskeletal   Abdominal   Peds  Hematology negative hematology ROS (+)   Anesthesia Other Findings   Reproductive/Obstetrics (+) Pregnancy                             Anesthesia Physical Anesthesia Plan  ASA: II  Anesthesia Plan: Epidural   Post-op Pain Management:    Induction:   Airway Management Planned:   Additional Equipment:   Intra-op Plan:   Post-operative Plan:   Informed Consent: I have reviewed the patients History and Physical, chart, labs and discussed the procedure including the risks, benefits and alternatives for the proposed anesthesia with the patient or authorized representative who has indicated his/her understanding and acceptance.   Dental advisory given  Plan Discussed with: Anesthesiologist  Anesthesia Plan Comments:         Anesthesia Quick Evaluation

## 2015-02-01 NOTE — Progress Notes (Signed)
Patient ID: Tiffany LoanJessica M Bass, female   DOB: 08/10/1986, 29 y.o.   MRN: [id number] Tiffany Bass is a 29 y.o. G1P0 at 595w1d.  Subjective: Increased pain w/ contractions. RN called CNM back to BS for decels.   Objective: BP 128/71 mmHg  Pulse 80  Temp(Src) 98.9 F (37.2 C) (Oral)  Resp 18  Ht 5\' 1"  (1.549 m)  Wt 213 lb (96.616 kg)  BMI 40.27 kg/m2  SpO2 100%  LMP 05/07/2014   FHT:  FHR: 140 bpm, variability: mod,  accelerations:  15x15,  decelerations:  episode of repetitive variables that resolved with position changes, DC Pitocin, fluid bolus, O2.  UC:   Q 2-3 minutes, strong Dilation: 7 Effacement (%): 80 Cervical Position: Posterior Station: 0 Presentation: Vertex Exam by:: Dorathy KinsmanVirginia Armas Mcbee, CNM  Progressed rapidly to 10/10/+2. Agent with urge to push.  Labs: Results for orders placed or performed during the hospital encounter of 01/31/15 (from the past 27 hour(s))  CBC     Status: Abnormal   Collection Time: 02/01/15  9:00 AM  Result Value Ref Range   WBC 12.3 (H) 4.0 - 10.5 K/uL   RBC 4.41 3.87 - 5.11 MIL/uL   Hemoglobin 12.9 12.0 - 15.0 g/dL   HCT 62.938.1 52.836.0 - 41.346.0 %   MCV 86.4 78.0 - 100.0 fL   MCH 29.3 26.0 - 34.0 pg   MCHC 33.9 30.0 - 36.0 g/dL   RDW 24.414.2 01.011.5 - 27.215.5 %   Platelets 131 (L) 150 - 400 K/uL    Assessment / Plan: 275w1d week IUP Labor: Began second stage. Fetal Wellbeing:  Category II, but improving significantly. Pain Control:  Epidural Anticipated MOD:  SVD Restart Pitocin when necessary after 30 minute reassuring tracing.  [place] Knob[place]ia Sarann Tregre, CNM 02/01/2015 27:27 PM

## 2015-02-01 NOTE — Anesthesia Procedure Notes (Addendum)
Epidural Patient location during procedure: OB  Staffing Anesthesiologist: Ikey Omary, CHRIS Performed by: anesthesiologist   Preanesthetic Checklist Completed: patient identified, surgical consent, pre-op evaluation, timeout performed, IV checked, risks and benefits discussed and monitors and equipment checked  Epidural Patient position: sitting Prep: site prepped and draped and DuraPrep Patient monitoring: heart rate, cardiac monitor, continuous pulse ox and blood pressure Approach: midline Location: L3-L4 Injection technique: LOR saline  Needle:  Needle type: Tuohy  Needle gauge: 17 G Needle length: 9 cm Needle insertion depth: 7 cm Catheter type: closed end flexible Catheter size: 19 Gauge Catheter at skin depth: 15 cm Test dose: negative and 2% lidocaine with Epi 1:200 K  Assessment Events: blood not aspirated, injection not painful, no injection resistance, negative IV test and no paresthesia  Additional Notes H+P and labs checked, risks and benefits discussed with the patient, consent obtained, procedure tolerated well and without complications.  Reason for block:procedure for pain   

## 2015-02-02 LAB — CBC
HCT: 33.2 % — ABNORMAL LOW (ref 36.0–46.0)
Hemoglobin: 10.9 g/dL — ABNORMAL LOW (ref 12.0–15.0)
MCH: 28.6 pg (ref 26.0–34.0)
MCHC: 32.8 g/dL (ref 30.0–36.0)
MCV: 87.1 fL (ref 78.0–100.0)
PLATELETS: 120 10*3/uL — AB (ref 150–400)
RBC: 3.81 MIL/uL — ABNORMAL LOW (ref 3.87–5.11)
RDW: 14.5 % (ref 11.5–15.5)
WBC: 12 10*3/uL — AB (ref 4.0–10.5)

## 2015-02-02 NOTE — Anesthesia Postprocedure Evaluation (Signed)
  Anesthesia Post-op Note  Patient: Tiffany Bass  Procedure(s) Performed: * No procedures listed *  Patient Location: Mother/Baby  Anesthesia Type:Epidural  Level of Consciousness: awake and alert   Airway and Oxygen Therapy: Patient Spontanous Breathing  Post-op Pain: mild  Post-op Assessment: Post-op Vital signs reviewed, Patient's Cardiovascular Status Stable, Respiratory Function Stable, No signs of Nausea or vomiting, Pain level controlled, No headache, No residual numbness and No residual motor weakness  Post-op Vital Signs: Reviewed  Last Vitals:  Filed Vitals:   02/02/15 0625  BP: 147/78  Pulse: 91  Temp: 36.4 C  Resp: 18    Complications: No apparent anesthesia complications

## 2015-02-02 NOTE — Progress Notes (Signed)
Post Partum Day 1 Subjective: no complaints, up ad lib, voiding, tolerating PO and Worried about baby who is in NICU  Objective: Blood pressure 125/83, pulse 100, temperature 98.6 F (37 C), temperature source Oral, resp. rate 20, height 5\' 1"  (1.549 m), weight 213 lb (96.616 kg), last menstrual period 05/07/2014, SpO2 100 %, unknown if currently breastfeeding.  Physical Exam:  General: alert, cooperative and no distress Lochia: appropriate Uterine Fundus: firm Incision: healing well DVT Evaluation: No evidence of DVT seen on physical exam.   Recent Labs  02/01/15 0900 02/01/15 1550  HGB 12.9 11.7*  HCT 38.1 34.9*    Assessment/Plan: Plan for discharge tomorrow, Breastfeeding and Lactation consult   LOS: 2 days   [facility] Center[facility]ILLIAMS,Tiffany Kovalcik 02/02/2015, 6:18 AM

## 2015-02-02 NOTE — Plan of Care (Signed)
Problem: Phase II Progression Outcomes Goal: Progress activity as tolerated unless otherwise ordered Outcome: Completed/Met Date Met:  02/02/15 Has ambulated and tolerated well. Goal: Rh isoimmunization per orders Outcome: Not Applicable Date Met:  48/25/00 Patient is O+

## 2015-02-03 MED ORDER — IBUPROFEN 600 MG PO TABS: 600.0000 mg | ORAL_TABLET | Freq: Four times a day (QID) | ORAL | Status: DC | PRN

## 2015-02-03 NOTE — Discharge Summary (Signed)
Obstetric Discharge Summary Reason for Admission: induction of labor d/t mild pre-e Prenatal Procedures: NST and ultrasound Intrapartum Procedures: spontaneous vaginal delivery and GBS prophylaxis Postpartum Procedures: none Complications-Operative and Postpartum: 2nd degree perineal laceration Eating, drinking, voiding, ambulating well.  +flatus.  Lochia and pain wnl.  Denies dizziness, lightheadedness, or sob. No complaints.   Hospital Course: Tiffany Bass is a 29 y.o. 511P1001 female admited at 37.0wks for IOL d/t mild pre-e. She received cytotec, then foley bulb, then pit and progressed to uncomplicated NSVB. Her pp course has been uncomplicated.  By PPD#2 she is doing well and is deemed to have received the full benefit of her hospital stay.  Filed Vitals:   02/02/15 0625  BP: 147/78  Pulse: 91  Temp: 97.6 F (36.4 C)  Resp: 18   H/H: Lab Results  Component Value Date/Time   HGB 10.9* 02/02/2015 05:53 AM   HCT 33.2* 02/02/2015 05:53 AM    Physical Exam: General: alert, cooperative and no distress Abdomen/Uterine Fundus: Appropriately tender, non-distended, FF @ U-2 Incision: n/a Lochia: appropriate Extremities: No evidence of DVT seen on physical exam. Negative Homan's sign, no cords, calf tenderness, or significant calf/ankle edema   Discharge Diagnoses: Term Pregnancy-delivered  After IOL for mild pre-e Discharge Information: Date: 03/26/2011 Activity: pelvic rest Diet: routine  Medications: PNV and Ibuprofen Breast feeding: Yes Contraception: POPs Circumcision: wants prior to baby's d/c from NICU Condition: stable Instructions: refer to handout Discharge to: home  Infant: in NICU d/t blood sugar instability  Follow-up Information    Follow up with [facility] On 02/07/2015.   Specialty:  Obstetrics and Gynecology   Why:  as scheduled   Contact information:   [address] [place] [id number] [phone]      Marge DuncansBooker,  Nalany Steedley Randall, CNM, [facility]NP-BC 02/03/2015,6:24 AM

## 2015-02-03 NOTE — Discharge Instructions (Signed)
Tips To Increase Milk Supply  Lots of water! Enough so that your urine is clear  Plenty of calories, if you're not getting enough calories, your milk supply can decrease  Breastfeed/pump often, every 2-3 hours x 20-83mins  Fenugreek 3 pills 3 times a day, this may make your urine smell like maple syrup  Mother's Milk Tea  Lactation cookies, google for the recipe  Real oatmeal   Postpartum Care After Vaginal Delivery After you deliver your newborn (postpartum period), the usual stay in the hospital is 24-72 hours. If there were problems with your labor or delivery, or if you have other medical problems, you might be in the hospital longer.  While you are in the hospital, you will receive help and instructions on how to care for yourself and your newborn during the postpartum period.  While you are in the hospital:  Be sure to tell your nurses if you have pain or discomfort, as well as where you feel the pain and what makes the pain worse.  If you had an incision made near your vagina (episiotomy) or if you had some tearing during delivery, the nurses may put ice packs on your episiotomy or tear. The ice packs may help to reduce the pain and swelling.  If you are breastfeeding, you may feel uncomfortable contractions of your uterus for a couple of weeks. This is normal. The contractions help your uterus get back to normal size.  It is normal to have some bleeding after delivery.  For the first 1-3 days after delivery, the flow is red and the amount may be similar to a period.  It is common for the flow to start and stop.  In the first few days, you may pass some small clots. Let your nurses know if you begin to pass large clots or your flow increases.  Do not  flush blood clots down the toilet before having the nurse look at them.  During the next 3-10 days after delivery, your flow should become more watery and pink or brown-tinged in color.  Ten to fourteen days after delivery,  your flow should be a small amount of yellowish-white discharge.  The amount of your flow will decrease over the first few weeks after delivery. Your flow may stop in 6-8 weeks. Most women have had their flow stop by 12 weeks after delivery.  You should change your sanitary pads frequently.  Wash your hands thoroughly with soap and water for at least 20 seconds after changing pads, using the toilet, or before holding or feeding your newborn.  You should feel like you need to empty your bladder within the first 6-8 hours after delivery.  In case you become weak, lightheaded, or faint, call your nurse before you get out of bed for the first time and before you take a shower for the first time.  Within the first few days after delivery, your breasts may begin to feel tender and full. This is called engorgement. Breast tenderness usually goes away within 48-72 hours after engorgement occurs. You may also notice milk leaking from your breasts. If you are not breastfeeding, do not stimulate your breasts. Breast stimulation can make your breasts produce more milk.  Spending as much time as possible with your newborn is very important. During this time, you and your newborn can feel close and get to know each other. Having your newborn stay in your room (rooming in) will help to strengthen the bond with your newborn. It will  give you time to get to know your newborn and become comfortable caring for your newborn.  Your hormones change after delivery. Sometimes the hormone changes can temporarily cause you to feel sad or tearful. These feelings should not last more than a few days. If these feelings last longer than that, you should talk to your caregiver.  If desired, talk to your caregiver about methods of family planning or contraception.  Talk to your caregiver about immunizations. Your caregiver may want you to have the following immunizations before leaving the hospital:  Tetanus, diphtheria, and  pertussis (Tdap) or tetanus and diphtheria (Td) immunization. It is very important that you and your family (including grandparents) or others caring for your newborn are up-to-date with the Tdap or Td immunizations. The Tdap or Td immunization can help protect your newborn from getting ill.  Rubella immunization.  Varicella (chickenpox) immunization.  Influenza immunization. You should receive this annual immunization if you did not receive the immunization during your pregnancy. Document Released: 06/28/2007 Document Revised: 05/25/2012 Document Reviewed: 04/27/2012 [facility] Patient Information 2015 [place], [place]. This information is not intended to replace advice given to you by your health care provider. Make sure you discuss any questions you have with your health care provider.   Breastfeeding Deciding to breastfeed is one of the best choices you can make for you and your baby. A change in hormones during pregnancy causes your breast tissue to grow and increases the number and size of your milk ducts. These hormones also allow proteins, sugars, and fats from your blood supply to make breast milk in your milk-producing glands. Hormones prevent breast milk from being released before your baby is born as well as prompt milk flow after birth. Once breastfeeding has begun, thoughts of your baby, as well as his or her sucking or crying, can stimulate the release of milk from your milk-producing glands.  BENEFITS OF BREASTFEEDING For Your Baby  Your first milk (colostrum) helps your baby's digestive system function better.   There are antibodies in your milk that help your baby fight off infections.   Your baby has a lower incidence of asthma, allergies, and sudden infant death syndrome.   The nutrients in breast milk are better for your baby than infant formulas and are designed uniquely for your baby's needs.   Breast milk improves your baby's brain development.   Your baby is less  likely to develop other conditions, such as childhood obesity, asthma, or type 2 diabetes mellitus.  For You   Breastfeeding helps to create a very special bond between you and your baby.   Breastfeeding is convenient. Breast milk is always available at the correct temperature and costs nothing.   Breastfeeding helps to burn calories and helps you lose the weight gained during pregnancy.   Breastfeeding makes your uterus contract to its prepregnancy size faster and slows bleeding (lochia) after you give birth.   Breastfeeding helps to lower your risk of developing type 2 diabetes mellitus, osteoporosis, and breast or ovarian cancer later in life. SIGNS THAT YOUR BABY IS HUNGRY Early Signs of Hunger  Increased alertness or activity.  Stretching.  Movement of the head from side to side.  Movement of the head and opening of the mouth when the corner of the mouth or cheek is stroked (rooting).  Increased sucking sounds, smacking lips, cooing, sighing, or squeaking.  Hand-to-mouth movements.  Increased sucking of fingers or hands. Late Signs of Hunger  Fussing.  Intermittent crying. Extreme Signs of  Hunger Signs of extreme hunger will require calming and consoling before your baby will be able to breastfeed successfully. Do not wait for the following signs of extreme hunger to occur before you initiate breastfeeding:   Restlessness.  A loud, strong cry.   Screaming. BREASTFEEDING BASICS Breastfeeding Initiation  Find a comfortable place to sit or lie down, with your neck and back well supported.  Place a pillow or rolled up blanket under your baby to bring him or her to the level of your breast (if you are seated). Nursing pillows are specially designed to help support your arms and your baby while you breastfeed.  Make sure that your baby's abdomen is facing your abdomen.   Gently massage your breast. With your fingertips, massage from your chest wall toward your  nipple in a circular motion. This encourages milk flow. You may need to continue this action during the feeding if your milk flows slowly.  Support your breast with 4 fingers underneath and your thumb above your nipple. Make sure your fingers are well away from your nipple and your baby's mouth.   Stroke your baby's lips gently with your finger or nipple.   When your baby's mouth is open wide enough, quickly bring your baby to your breast, placing your entire nipple and as much of the colored area around your nipple (areola) as possible into your baby's mouth.   More areola should be visible above your baby's upper lip than below the lower lip.   Your baby's tongue should be between his or her lower gum and your breast.   Ensure that your baby's mouth is correctly positioned around your nipple (latched). Your baby's lips should create a seal on your breast and be turned out (everted).  It is common for your baby to suck about 2-3 minutes in order to start the flow of breast milk. Latching Teaching your baby how to latch on to your breast properly is very important. An improper latch can cause nipple pain and decreased milk supply for you and poor weight gain in your baby. Also, if your baby is not latched onto your nipple properly, he or she may swallow some air during feeding. This can make your baby fussy. Burping your baby when you switch breasts during the feeding can help to get rid of the air. However, teaching your baby to latch on properly is still the best way to prevent fussiness from swallowing air while breastfeeding. Signs that your baby has successfully latched on to your nipple:    Silent tugging or silent sucking, without causing you pain.   Swallowing heard between every 3-4 sucks.    Muscle movement above and in front of his or her ears while sucking.  Signs that your baby has not successfully latched on to nipple:   Sucking sounds or smacking sounds from your  baby while breastfeeding.  Nipple pain. If you think your baby has not latched on correctly, slip your finger into the corner of your baby's mouth to break the suction and place it between your baby's gums. Attempt breastfeeding initiation again. Signs of Successful Breastfeeding Signs from your baby:   A gradual decrease in the number of sucks or complete cessation of sucking.   Falling asleep.   Relaxation of his or her body.   Retention of a small amount of milk in his or her mouth.   Letting go of your breast by himself or herself. Signs from you:  Breasts that have increased  in firmness, weight, and size 1-3 hours after feeding.   Breasts that are softer immediately after breastfeeding.  Increased milk volume, as well as a change in milk consistency and color by the fifth day of breastfeeding.   Nipples that are not sore, cracked, or bleeding. Signs That Your Pecola Leisure is Getting Enough Milk  Wetting at least 3 diapers in a 24-hour period. The urine should be clear and pale yellow by age 62 days.  At least 3 stools in a 24-hour period by age 62 days. The stool should be soft and yellow.  At least 3 stools in a 24-hour period by age 6 days. The stool should be seedy and yellow.  No loss of weight greater than 10% of birth weight during the first 23 days of age.  Average weight gain of 4-7 ounces (113-198 g) per week after age 27 days.  Consistent daily weight gain by age 62 days, without weight loss after the age of 2 weeks. After a feeding, your baby may spit up a small amount. This is common. BREASTFEEDING FREQUENCY AND DURATION Frequent feeding will help you make more milk and can prevent sore nipples and breast engorgement. Breastfeed when you feel the need to reduce the fullness of your breasts or when your baby shows signs of hunger. This is called "breastfeeding on demand." Avoid introducing a pacifier to your baby while you are working to establish breastfeeding (the  first 4-6 weeks after your baby is born). After this time you may choose to use a pacifier. Research has shown that pacifier use during the first year of a baby's life decreases the risk of sudden infant death syndrome (SIDS). Allow your baby to feed on each breast as long as he or she wants. Breastfeed until your baby is finished feeding. When your baby unlatches or falls asleep while feeding from the first breast, offer the second breast. Because newborns are often sleepy in the first few weeks of life, you may need to awaken your baby to get him or her to feed. Breastfeeding times will vary from baby to baby. However, the following rules can serve as a guide to help you ensure that your baby is properly fed:  Newborns (babies 10 weeks of age or younger) may breastfeed every 1-3 hours.  Newborns should not go longer than 3 hours during the day or 5 hours during the night without breastfeeding.  You should breastfeed your baby a minimum of 8 times in a 24-hour period until you begin to introduce solid foods to your baby at around 72 months of age. BREAST MILK PUMPING Pumping and storing breast milk allows you to ensure that your baby is exclusively fed your breast milk, even at times when you are unable to breastfeed. This is especially important if you are going back to work while you are still breastfeeding or when you are not able to be present during feedings. Your lactation consultant can give you guidelines on how long it is safe to store breast milk.  A breast pump is a machine that allows you to pump milk from your breast into a sterile bottle. The pumped breast milk can then be stored in a refrigerator or freezer. Some breast pumps are operated by hand, while others use electricity. Ask your lactation consultant which type will work best for you. Breast pumps can be purchased, but some hospitals and breastfeeding support groups lease breast pumps on a monthly basis. A lactation consultant can  teach you how  to hand express breast milk, if you prefer not to use a pump.  CARING FOR YOUR BREASTS WHILE YOU BREASTFEED Nipples can become dry, cracked, and sore while breastfeeding. The following recommendations can help keep your breasts moisturized and healthy:  Avoid using soap on your nipples.   Wear a supportive bra. Although not required, special nursing bras and tank tops are designed to allow access to your breasts for breastfeeding without taking off your entire bra or top. Avoid wearing underwire-style bras or extremely tight bras.  Air dry your nipples for 3-344minutes after each feeding.   Use only cotton bra pads to absorb leaked breast milk. Leaking of breast milk between feedings is normal.   Use lanolin on your nipples after breastfeeding. Lanolin helps to maintain your skin's normal moisture barrier. If you use pure lanolin, you do not need to wash it off before feeding your baby again. Pure lanolin is not toxic to your baby. You may also hand express a few drops of breast milk and gently massage that milk into your nipples and allow the milk to air dry. In the first few weeks after giving birth, some women experience extremely full breasts (engorgement). Engorgement can make your breasts feel heavy, warm, and tender to the touch. Engorgement peaks within 3-5 days after you give birth. The following recommendations can help ease engorgement:  Completely empty your breasts while breastfeeding or pumping. You may want to start by applying warm, moist heat (in the shower or with warm water-soaked hand towels) just before feeding or pumping. This increases circulation and helps the milk flow. If your baby does not completely empty your breasts while breastfeeding, pump any extra milk after he or she is finished.  Wear a snug bra (nursing or regular) or tank top for 1-2 days to signal your body to slightly decrease milk production.  Apply ice packs to your breasts, unless this is  too uncomfortable for you.  Make sure that your baby is latched on and positioned properly while breastfeeding. If engorgement persists after 48 hours of following these recommendations, contact your health care provider or a Advertising copywriterlactation consultant. OVERALL HEALTH CARE RECOMMENDATIONS WHILE BREASTFEEDING  Eat healthy foods. Alternate between meals and snacks, eating 3 of each per day. Because what you eat affects your breast milk, some of the foods may make your baby more irritable than usual. Avoid eating these foods if you are sure that they are negatively affecting your baby.  Drink milk, fruit juice, and water to satisfy your thirst (about 10 glasses a day).   Rest often, relax, and continue to take your prenatal vitamins to prevent fatigue, stress, and anemia.  Continue breast self-awareness checks.  Avoid chewing and smoking tobacco.  Avoid alcohol and drug use. Some medicines that may be harmful to your baby can pass through breast milk. It is important to ask your health care provider before taking any medicine, including all over-the-counter and prescription medicine as well as vitamin and herbal supplements. It is possible to become pregnant while breastfeeding. If birth control is desired, ask your health care provider about options that will be safe for your baby. SEEK MEDICAL CARE IF:   You feel like you want to stop breastfeeding or have become frustrated with breastfeeding.  You have painful breasts or nipples.  Your nipples are cracked or bleeding.  Your breasts are red, tender, or warm.  You have a swollen area on either breast.  You have a fever or chills.  You  have nausea or vomiting.  You have drainage other than breast milk from your nipples.  Your breasts do not become full before feedings by the fifth day after you give birth.  You feel sad and depressed.  Your baby is too sleepy to eat well.  Your baby is having trouble sleeping.   Your baby is  wetting less than 3 diapers in a 24-hour period.  Your baby has less than 3 stools in a 24-hour period.  Your baby's skin or the white part of his or her eyes becomes yellow.   Your baby is not gaining weight by 17 days of age. SEEK IMMEDIATE MEDICAL CARE IF:   Your baby is overly tired (lethargic) and does not want to wake up and feed.  Your baby develops an unexplained fever. Document Released: 08/31/2005 Document Revised: 09/05/2013 Document Reviewed: 02/22/2013 [facility] Patient Information 2015 [place], [place]. This information is not intended to replace advice given to you by your health care provider. Make sure you discuss any questions you have with your health care provider.

## 2015-02-07 ENCOUNTER — Encounter: Payer: 59 | Admitting: Advanced Practice Midwife

## 2015-02-13 ENCOUNTER — Encounter: Payer: Self-pay | Admitting: Advanced Practice Midwife

## 2015-02-13 ENCOUNTER — Ambulatory Visit (INDEPENDENT_AMBULATORY_CARE_PROVIDER_SITE_OTHER): Payer: 59 | Admitting: Advanced Practice Midwife

## 2015-02-13 VITALS — BP 138/82 | HR 80 | Wt 200.0 lb

## 2015-02-13 DIAGNOSIS — O1404 Mild to moderate pre-eclampsia, complicating childbirth: Secondary | ICD-10-CM

## 2015-02-13 DIAGNOSIS — O1400 Mild to moderate pre-eclampsia, unspecified trimester: Secondary | ICD-10-CM | POA: Diagnosis not present

## 2015-02-13 DIAGNOSIS — Z0130 Encounter for examination of blood pressure without abnormal findings: Secondary | ICD-10-CM

## 2015-02-13 NOTE — Progress Notes (Signed)
[facility] Visit  Patient name: Tiffany Bass Fries MRN [id number]  Date of birth: 12/22/1985  CC & HPI:  Tiffany Bass Davidson is a 29 y.o. Caucasian female presenting today for BP check. She had SVD 10 days ago following IOL fo rmild preeclampsia.  She was never on BP meds. Denies HA/RUQ pain, visijon changes. Is bottle feeding and plans COCs.   Pertinent History Reviewed:  Medical & Surgical Hx:   Past Medical History  Diagnosis Date  . Pregnant 06/19/2014  . Supervision of normal pregnancy in first trimester 06/29/2014     [facility]  Roxana HiresChris  [place] 29 yo wm 1st Dating By US Pap 10/06/12 GC/CT Initial:                36+wks: Genetic Screen NT/IT:  CF screen  Anatomic US  Flu vaccine  Tdap Recommended ~ 28wks Glucose Screen  2 hr GBS  Feed Preference  Contraception  Circumcision  Childbirth Classes  Pediatrician    . Medical history non-contributory    Past Surgical History  Procedure Laterality Date  . Cholecystectomy     Family History  Problem Relation Age of Onset  . Diabetes Paternal Grandmother   . COPD Father   . Asthma Father   . Hypertension Mother   . Emphysema Paternal Grandfather     Current outpatient prescriptions:  .  Pediatric Multiple Vit-C-FA (FLINSTONES GUMMIES OMEGA-3 DHA) CHEW, Chew 2 tablets by mouth daily. , Disp: , Rfl:  .  ibuprofen (ADVIL,MOTRIN) 600 MG tablet, Take 1 tablet (600 mg total) by mouth every 6 (six) hours as needed for mild pain, moderate pain or cramping. (Patient not taking: Reported on 02/13/2015), Disp: 30 tablet, Rfl: 0 Social History: Reviewed -  reports that she has never smoked. She has never used smokeless tobacco.  Review of Systems:   Constitutional: Negative for fever and chills Eyes: Negative for visual disturbances Respiratory: Negative for shortness of breath, dyspnea Cardiovascular: Negative for chest pain or palpitations  Gastrointestinal: Negative for vomiting, diarrhea and constipation; no abdominal  pain Genitourinary: Negative for dysuria and urgency, vaginal irritation or itching Musculoskeletal: Negative for back pain, joint pain, myalgias  Neurological: Negative for dizziness and headaches    Objective Findings:  Vitals: BP 138/82 mmHg  Pulse 80  Wt 200 lb (90.719 kg)  Breastfeeding? No  Physical Examination: General appearance - alert, well appearing, and in no distress Mental status - alert, oriented to person, place, and time Chest - normal respiratory effort Heart - S1 and S2 normal Extremities - edema much better now  No results found for this or any previous visit (from the past 24 hour(s)).       Assessment & Plan:  A:   Mild preeclampsia, resolved. P:  Plan COC's    F/U 3 weeks for postpartum checkup   CRESENZO-DISHMAN,Davier Tramell CNM 02/13/2015 4:06 PM

## 2015-02-18 DIAGNOSIS — Z029 Encounter for administrative examinations, unspecified: Secondary | ICD-10-CM

## 2015-02-19 ENCOUNTER — Telehealth: Payer: Self-pay | Admitting: Women's Health

## 2015-02-19 ENCOUNTER — Ambulatory Visit (INDEPENDENT_AMBULATORY_CARE_PROVIDER_SITE_OTHER): Payer: 59 | Admitting: Obstetrics and Gynecology

## 2015-02-19 ENCOUNTER — Encounter: Payer: Self-pay | Admitting: Obstetrics and Gynecology

## 2015-02-19 VITALS — BP 118/84 | HR 92 | Ht 61.0 in | Wt 198.8 lb

## 2015-02-19 DIAGNOSIS — M79604 Pain in right leg: Secondary | ICD-10-CM

## 2015-02-19 NOTE — Progress Notes (Signed)
Patient ID: Tiffany Bass, female   DOB: 06/23/1986, 29 y.o.   MRN: [id number]   [facility]  Patient name: Tiffany LoanJessica M Burry MRN [id number]  Date of birth: 05/30/1986  CC & HPI:  Tiffany LoanJessica M Berthelot is a 29 y.o. female presenting today for concern over ?tenderness in right calf and right inner thigh without redness.  ROS:  Pt is a Magazine features editorself-confessed worrier. Baby COOPER was IOL for SGA at 37 wk. So pt's worrying is reinforced.  Pertinent History Reviewed:   Reviewed: Significant for Cooper's doing great Medical         Past Medical History  Diagnosis Date  . Pregnant 06/19/2014  . Supervision of normal pregnancy in first trimester 06/29/2014     [facility]  Roxana HiresChris  [place] 29 yo wm 1st Dating By US Pap 10/06/12 GC/CT Initial:                36+wks: Genetic Screen NT/IT:  CF screen  Anatomic US  Flu vaccine  Tdap Recommended ~ 28wks Glucose Screen  2 hr GBS  Feed Preference  Contraception  Circumcision  Childbirth Classes  Pediatrician    . Medical history non-contributory                               Surgical Hx:    Past Surgical History  Procedure Laterality Date  . Cholecystectomy     Medications: Reviewed & Updated - see associated section                       Current outpatient prescriptions:  .  Pediatric Multiple Vit-C-FA (FLINSTONES GUMMIES OMEGA-3 DHA) CHEW, Chew 2 tablets by mouth daily. , Disp: , Rfl:  .  ibuprofen (ADVIL,MOTRIN) 600 MG tablet, Take 1 tablet (600 mg total) by mouth every 6 (six) hours as needed for mild pain, moderate pain or cramping. (Patient not taking: Reported on 02/13/2015), Disp: 30 tablet, Rfl: 0   Social History: Reviewed -  reports that she has never smoked. She has never used smokeless tobacco.  Objective Findings:  Vitals: Blood pressure 118/84, pulse 92, height 5\' 1"  (1.549 m), weight 198 lb 12.8 oz (90.175 kg), not currently breastfeeding.  Physical Examination: Extremities - peripheral pulses normal, no pedal  edema, no clubbing or cyanosis, no pedal edema noted, Homan's sign negative bilaterally, calf size is 0.5 cm SMALLER on right at 22 cm above med. Malleolus of ankle. Neg homans.   Assessment & Plan:   A:  1. No evidence of DVT. 2. Musculoskeletal discomforts of the right calf  P:  1. Reassure. 2. Local heat prn, shower  3 continue normal activity.4 recheck prn increase in pain, redness, sob.

## 2015-02-19 NOTE — Telephone Encounter (Signed)
Pt called stating that she is concerned about blood clot in leg, she read on dc papers from her delivery on 5/20 that is something she should watch for.  Pt states her rt calve was hurting this morning and now pain has moved up next to her knee, she states leg is warm to touch but not any more swollen than the lt one and no redness.  She states she has not felt well today with chills and weakness and the leg pain and she is concerned.  Pt advised need for OV to assess, pt agrees and was told to be here at 4 to see Dr. Emelda FearFerguson.

## 2015-03-06 ENCOUNTER — Encounter: Payer: Self-pay | Admitting: Women's Health

## 2015-03-06 ENCOUNTER — Ambulatory Visit (INDEPENDENT_AMBULATORY_CARE_PROVIDER_SITE_OTHER): Payer: 59 | Admitting: Women's Health

## 2015-03-06 DIAGNOSIS — Z3202 Encounter for pregnancy test, result negative: Secondary | ICD-10-CM

## 2015-03-06 LAB — POCT URINE PREGNANCY: PREG TEST UR: NEGATIVE

## 2015-03-06 MED ORDER — NORETHIN-ETH ESTRAD-FE BIPHAS 1 MG-10 MCG / 10 MCG PO TABS: 1.0000 | ORAL_TABLET | Freq: Every day | ORAL | Status: DC

## 2015-03-06 NOTE — Patient Instructions (Signed)
Oral Contraception Use Oral contraceptive pills (OCPs) are medicines taken to prevent pregnancy. OCPs work by preventing the ovaries from releasing eggs. The hormones in OCPs also cause the cervical mucus to thicken, preventing the sperm from entering the uterus. The hormones also cause the uterine lining to become thin, not allowing a fertilized egg to attach to the inside of the uterus. OCPs are highly effective when taken exactly as prescribed. However, OCPs do not prevent sexually transmitted diseases (STDs). Safe sex practices, such as using condoms along with an OCP, can help prevent STDs. Before taking OCPs, you may have a physical exam and Pap test. Your health care provider may also order blood tests if necessary. Your health care provider will make sure you are a good candidate for oral contraception. Discuss with your health care provider the possible side effects of the OCP you may be prescribed. When starting an OCP, it can take 2 to 3 months for the body to adjust to the changes in hormone levels in your body.  HOW TO TAKE ORAL CONTRACEPTIVE PILLS Your health care provider may advise you on how to start taking the first cycle of OCPs. Otherwise, you can:   Start on day 1 of your menstrual period. You will not need any backup contraceptive protection with this start time.   Start on the first Sunday after your menstrual period or the day you get your prescription. In these cases, you will need to use backup contraceptive protection for the first week.   Start the pill at any time of your cycle. If you take the pill within 5 days of the start of your period, you are protected against pregnancy right away. In this case, you will not need a backup form of birth control. If you start at any other time of your menstrual cycle, you will need to use another form of birth control for 7 days. If your OCP is the type called a minipill, it will protect you from pregnancy after taking it for 2 days (48  hours). After you have started taking OCPs:   If you forget to take 1 pill, take it as soon as you remember. Take the next pill at the regular time.   If you miss 2 or more pills, call your health care provider because different pills have different instructions for missed doses. Use backup birth control until your next menstrual period starts.   If you use a 28-day pack that contains inactive pills and you miss 1 of the last 7 pills (pills with no hormones), it will not matter. Throw away the rest of the non-hormone pills and start a new pill pack.  No matter which day you start the OCP, you will always start a new pack on that same day of the week. Have an extra pack of OCPs and a backup contraceptive method available in case you miss some pills or lose your OCP pack.  HOME CARE INSTRUCTIONS   Do not smoke.   Always use a condom to protect against STDs. OCPs do not protect against STDs.   Use a calendar to mark your menstrual period days.   Read the information and directions that came with your OCP. Talk to your health care provider if you have questions.  SEEK MEDICAL CARE IF:   You develop nausea and vomiting.   You have abnormal vaginal discharge or bleeding.   You develop a rash.   You miss your menstrual period.   You are losing   your hair.   You need treatment for mood swings or depression.   You get dizzy when taking the OCP.   You develop acne from taking the OCP.   You become pregnant.  SEEK IMMEDIATE MEDICAL CARE IF:   You develop chest pain.   You develop shortness of breath.   You have an uncontrolled or severe headache.   You develop numbness or slurred speech.   You develop visual problems.   You develop pain, redness, and swelling in the legs.  Document Released: 08/20/2011 Document Revised: 01/15/2014 Document Reviewed: 02/19/2013 [facility] Patient Information 2015 [place], [place]. This information is not intended to replace  advice given to you by your health care provider. Make sure you discuss any questions you have with your health care provider.  Ethinyl Estradiol; Norethindrone Acetate; Ferrous fumarate tablets (contraception) What is this medicine? ETHINYL ESTRADIOL; NORETHINDRONE ACETATE; FERROUS FUMARATE (ETH in il es tra DYE ole; nor eth IN drone AS e tate; FER [place] FUE ma rate) is an oral contraceptive. The products combine two types of female hormones, an estrogen and a progestin. They are used to prevent ovulation and pregnancy. Some products are also used to treat acne in females. This medicine may be used for other purposes; ask your health care provider or pharmacist if you have questions. COMMON BRAND NAME(S): Estrostep Fe, Gildess Fe 1.5/30, Gildess Fe 1/20, Junel Fe 1.5/30, Junel Fe 1/20, Larin Fe, Lo Loestrin Fe, Loestrin 24 Fe, Loestrin FE 1.5/30, Loestrin FE 1/20, Lomedia 24 Fe, Microgestin Fe 1.5/30, Microgestin Fe 1/20, Tarina Fe 1/20, Tilia Fe, Tri-Legest Fe What should I tell my health care provider before I take this medicine? They need to know if you have any of these conditions: -abnormal vaginal bleeding -blood vessel disease -breast, cervical, endometrial, ovarian, liver, or uterine cancer -diabetes -gallbladder disease -heart disease or recent heart attack -high blood pressure -high cholesterol -history of blood clots -kidney disease -liver disease -migraine headaches -smoke tobacco -stroke -systemic lupus erythematosus (SLE) -an unusual or allergic reaction to estrogens, progestins, other medicines, foods, dyes, or preservatives -pregnant or trying to get pregnant -breast-feeding How should I use this medicine? Take this medicine by mouth. To reduce nausea, this medicine may be taken with food. Follow the directions on the prescription label. Take this medicine at the same time each day and in the order directed on the package. Do not take your medicine more often than  directed. A patient package insert for the product will be given with each prescription and refill. Read this sheet carefully each time. The sheet may change frequently. Contact your pediatrician regarding the use of this medicine in children. Special care may be needed. This medicine has been used in female children who have started having menstrual periods. Overdosage: If you think you've taken too much of this medicine contact a poison control center or emergency room at once. Overdosage: If you think you have taken too much of this medicine contact a poison control center or emergency room at once. NOTE: This medicine is only for you. Do not share this medicine with others. What if I miss a dose? If you miss a dose, refer to the patient information sheet you received with your medicine for direction. If you miss more than one pill, this medicine may not be as effective and you may need to use another form of birth control. What may interact with this medicine? -acetaminophen -antibiotics or medicines for infections, especially rifampin, rifabutin, rifapentine, and griseofulvin, and possibly  penicillins or tetracyclines -aprepitant -ascorbic acid (vitamin C) -atorvastatin -barbiturate medicines, such as phenobarbital -bosentan -carbamazepine -caffeine -clofibrate -cyclosporine -dantrolene -doxercalciferol -felbamate -grapefruit juice -hydrocortisone -medicines for anxiety or sleeping problems, such as diazepam or temazepam -medicines for diabetes, including pioglitazone -mineral oil -modafinil -mycophenolate -nefazodone -oxcarbazepine -phenytoin -prednisolone -ritonavir or other medicines for HIV infection or AIDS -rosuvastatin -selegiline -soy isoflavones supplements -St. John's wort -tamoxifen or raloxifene -theophylline -thyroid hormones -topiramate -warfarin This list may not describe all possible interactions. Give your health care provider a list of all the  medicines, herbs, non-prescription drugs, or dietary supplements you use. Also tell them if you smoke, drink alcohol, or use illegal drugs. Some items may interact with your medicine. What should I watch for while using this medicine? Visit your doctor or health care professional for regular checks on your progress. You will need a regular breast and pelvic exam and Pap smear while on this medicine. Use an additional method of contraception during the first cycle that you take these tablets. If you have any reason to think you are pregnant, stop taking this medicine right away and contact your doctor or health care professional. If you are taking this medicine for hormone related problems, it may take several cycles of use to see improvement in your condition. Smoking increases the risk of getting a blood clot or having a stroke while you are taking birth control pills, especially if you are more than 27 years old. You are strongly advised not to smoke. This medicine can make your body retain fluid, making your fingers, hands, or ankles swell. Your blood pressure can go up. Contact your doctor or health care professional if you feel you are retaining fluid. This medicine can make you more sensitive to the sun. Keep out of the sun. If you cannot avoid being in the sun, wear protective clothing and use sunscreen. Do not use sun lamps or tanning beds/booths. If you wear contact lenses and notice visual changes, or if the lenses begin to feel uncomfortable, consult your eye care specialist. In some women, tenderness, swelling, or minor bleeding of the gums may occur. Notify your dentist if this happens. Brushing and flossing your teeth regularly may help limit this. See your dentist regularly and inform your dentist of the medicines you are taking. If you are going to have elective surgery, you may need to stop taking this medicine before the surgery. Consult your health care professional for advice. This  medicine does not protect you against HIV infection (AIDS) or any other sexually transmitted diseases. What side effects may I notice from receiving this medicine? Side effects that you should report to your doctor or health care professional as soon as possible: -allergic reactions like skin rash, itching or hives, swelling of the face, lips, or tongue -breast tissue changes or discharge -changes in vaginal bleeding during your period or between your periods -changes in vision -chest pain -confusion -coughing up blood -dizziness -feeling faint or lightheaded -headaches or migraines -leg, arm or groin pain -loss of balance or coordination -severe or sudden headaches -stomach pain (severe) -sudden shortness of breath -sudden numbness or weakness of the face, arm or leg -symptoms of vaginal infection like itching, irritation or unusual discharge -tenderness in the upper abdomen -trouble speaking or understanding -vomiting -yellowing of the eyes or skin Side effects that usually do not require medical attention (Report these to your doctor or health care professional if they continue or are bothersome.): -breakthrough bleeding and spotting that continues beyond  the 3 initial cycles of pills -breast tenderness -mood changes, anxiety, depression, frustration, anger, or emotional outbursts -increased sensitivity to sun or ultraviolet light -nausea -skin rash, acne, or brown spots on the skin -weight gain (slight) This list may not describe all possible side effects. Call your doctor for medical advice about side effects. You may report side effects to FDA at 1-800-FDA-1088. Where should I keep my medicine? Keep out of the reach of children. Store at room temperature between 15 and 30 degrees C (59 and 86 degrees F). Throw away any unused medicine after the expiration date. NOTE: This sheet is a summary. It may not cover all possible information. If you have questions about this  medicine, talk to your doctor, pharmacist, or health care provider.  2015, [employer]vier/[employer] Standard. (2013-01-04 15:05:22)

## 2015-03-06 NOTE — Progress Notes (Signed)
Patient ID: Tiffany Bass, female   DOB: 03/25/1986, 29 y.o.   MRN: [id number] Subjective:    Tiffany Bass is a 29 y.o. G25P1001 Caucasian female who presents for a postpartum visit. She is 6 weeks postpartum following a spontaneous vaginal delivery at 37.0 gestational weeks. Anesthesia: epidural. I have fully reviewed the prenatal and intrapartum course. Postpartum course has been uncomplicated. Baby's course has been complicated by 1wk NICU stay d/t hypoglycemia. Baby is feeding by bottle. Bleeding no bleeding. Bowel function is normal. Bladder function is normal. Patient is not sexually active. Last sexual activity: prior to birth of baby. Contraception method is wants COCs. Does not smoke, no h/o HTN, DVT/PE, CVA, MI, or migraines w/ aura. Postpartum depression screening: negative. Score 2.  Last pap 2014 and was neg.  The following portions of the patient's history were reviewed and updated as appropriate: allergies, current medications, past medical history, past surgical history and problem list.  Review of Systems Pertinent items are noted in HPI.   Filed Vitals:   03/06/15 1101  BP: 122/62  Pulse: 64  Weight: 202 lb (91.627 kg)   No LMP recorded.  Objective:   General:  alert, cooperative and no distress   Breasts:  deferred, no complaints  Lungs: clear to auscultation bilaterally  Heart:  regular rate and rhythm  Abdomen: soft, nontender   Vulva: normal  Vagina: normal vagina  Cervix:  closed  Corpus: Well-involuted  Adnexa:  Non-palpable  Rectal Exam: no hemorrhoids        Assessment:   Postpartum exam 6 wks s/p SVB after IOL for mild pre-e Bottlefeeding Depression screening Contraception counseling   Plan:   Contraception: rx Lo Loestrin w/ 11RF Follow up in: 3 months for COC f/u, then Jan for pap or earlier if needed  Marge Duncans CNM, [facility] 03/06/2015 11:35 AM

## 2015-06-11 ENCOUNTER — Ambulatory Visit: Payer: 59 | Admitting: Women's Health

## 2017-03-22 ENCOUNTER — Other Ambulatory Visit: Payer: 59 | Admitting: Adult Health

## 2017-04-28 ENCOUNTER — Encounter: Payer: Self-pay | Admitting: Adult Health

## 2017-04-28 ENCOUNTER — Other Ambulatory Visit (HOSPITAL_COMMUNITY)
Admission: RE | Admit: 2017-04-28 | Discharge: 2017-04-28 | Disposition: A | Discharge status: Home / Self Care | Payer: Self-pay | Source: Ambulatory Visit | Attending: Obstetrics & Gynecology | Admitting: Obstetrics & Gynecology

## 2017-04-28 ENCOUNTER — Ambulatory Visit (INDEPENDENT_AMBULATORY_CARE_PROVIDER_SITE_OTHER): Payer: 59 | Admitting: Adult Health

## 2017-04-28 VITALS — BP 124/82 | HR 98 | Ht 61.5 in | Wt 217.5 lb

## 2017-04-28 DIAGNOSIS — Z01419 Encounter for gynecological examination (general) (routine) without abnormal findings: Secondary | ICD-10-CM | POA: Insufficient documentation

## 2017-04-28 DIAGNOSIS — Z319 Encounter for procreative management, unspecified: Secondary | ICD-10-CM

## 2017-04-28 DIAGNOSIS — N926 Irregular menstruation, unspecified: Secondary | ICD-10-CM

## 2017-04-28 NOTE — Progress Notes (Signed)
Patient ID: Tiffany Bass, female   DOB: 11/24/1985, 31 y.o.   Mare LoanMRN: [id number] History of Present Illness:  Tiffany Bass is a 31 year old white female, married in for a well woman gyn exam and pap.She has 29 year old son at home.  PCP is Dr Tiffany Bass.   Current Medications, Allergies, Past Medical History, Past Surgical History, Family History and Social History were reviewed in [facility] electronic medical record.     Review of Systems:  Patient denies any headaches, hearing loss, fatigue, blurred vision, shortness of breath, chest pain, abdominal pain, problems with bowel movements, urination, or intercourse. No joint pain or mood swings. Period irregular, wants to be pregnant has been trying 1 year.   Physical Exam:BP 124/82 (BP Location: Left Arm, Patient Position: Sitting, Cuff Size: Large)   Pulse 98   Ht 5' 1.5" (1.562 m)   Wt 217 lb 8 oz (98.7 kg)   LMP 04/23/2017   BMI 40.43 kg/m  General:  Well developed, well nourished, no acute distress Skin:  Warm and dry.several tattoos  Neck:  Midline trachea, normal thyroid, good ROM, no lymphadenopathy Lungs; Clear to auscultation bilaterally Breast:  No dominant palpable mass, retraction, or nipple discharge Cardiovascular: Regular rate and rhythm Abdomen:  Soft, non tender, no hepatosplenomegaly Pelvic:  External genitalia is normal in appearance, no lesions.  The vagina is normal in appearance. Urethra has no lesions or masses. The cervix is bulbous.+blood at os, pap with reflex HPV performed.  Uterus is felt to be normal size, shape, and contour.  No adnexal masses or tenderness noted.Bladder is non tender, no masses felt. Extremities/musculoskeletal:  No swelling or varicosities noted, no clubbing or cyanosis Psych:  No mood changes, alert and cooperative,seems happy PHQ 2 score 0.Discussed having sex every other day 7-24 of cycle and will check progesterone day 21 of this cycle, which is 05/13/17.  Impression: 1. Encounter for  gynecological examination with Papanicolaou smear of cervix   2. Irregular periods   3. Patient desires pregnancy       Plan: Check progesterone 8/30 Physical in 1 year Pap in 3 if normal Take OTC Prenatal vitamin

## 2017-04-30 LAB — CYTOLOGY - PAP: Diagnosis: NEGATIVE

## 2017-05-14 LAB — PROGESTERONE: PROGESTERONE: 0.6 ng/mL

## 2017-05-18 ENCOUNTER — Encounter: Payer: Self-pay | Admitting: Adult Health

## 2017-05-19 ENCOUNTER — Other Ambulatory Visit: Payer: Self-pay | Admitting: Adult Health

## 2017-05-19 MED ORDER — CLOMIPHENE CITRATE 50 MG PO TABS: ORAL_TABLET | ORAL | 2 refills | Status: DC

## 2017-05-19 NOTE — Progress Notes (Signed)
Will rx clomid 

## 2017-06-14 ENCOUNTER — Telehealth: Payer: Self-pay | Admitting: Adult Health

## 2017-06-14 DIAGNOSIS — Z319 Encounter for procreative management, unspecified: Secondary | ICD-10-CM

## 2017-06-14 NOTE — Telephone Encounter (Signed)
Order in for 10/2 check progesterone

## 2017-06-22 ENCOUNTER — Encounter: Payer: Self-pay | Admitting: Adult Health

## 2017-06-23 ENCOUNTER — Telehealth: Payer: Self-pay | Admitting: Adult Health

## 2017-06-23 LAB — PROGESTERONE: PROGESTERONE: 1 ng/mL

## 2017-06-23 NOTE — Telephone Encounter (Signed)
Pt aware progesterone level is 1, if starts, take clomid again

## 2017-06-29 ENCOUNTER — Encounter: Payer: Self-pay | Admitting: Adult Health

## 2017-06-30 ENCOUNTER — Other Ambulatory Visit: Payer: Self-pay | Admitting: Adult Health

## 2017-06-30 DIAGNOSIS — Z319 Encounter for procreative management, unspecified: Secondary | ICD-10-CM

## 2017-12-08 DIAGNOSIS — J4520 Mild intermittent asthma, uncomplicated: Secondary | ICD-10-CM | POA: Diagnosis not present

## 2017-12-08 DIAGNOSIS — F419 Anxiety disorder, unspecified: Secondary | ICD-10-CM | POA: Diagnosis not present

## 2017-12-08 DIAGNOSIS — E669 Obesity, unspecified: Secondary | ICD-10-CM | POA: Diagnosis not present

## 2018-08-25 DIAGNOSIS — J029 Acute pharyngitis, unspecified: Secondary | ICD-10-CM | POA: Diagnosis not present

## 2018-08-25 DIAGNOSIS — H6691 Otitis media, unspecified, right ear: Secondary | ICD-10-CM | POA: Diagnosis not present

## 2018-08-25 DIAGNOSIS — J019 Acute sinusitis, unspecified: Secondary | ICD-10-CM | POA: Diagnosis not present

## 2019-01-13 DIAGNOSIS — J4520 Mild intermittent asthma, uncomplicated: Secondary | ICD-10-CM | POA: Diagnosis not present

## 2019-01-13 DIAGNOSIS — J410 Simple chronic bronchitis: Secondary | ICD-10-CM | POA: Diagnosis not present

## 2019-01-13 DIAGNOSIS — T63791A Toxic effect of contact with other venomous plant, accidental (unintentional), initial encounter: Secondary | ICD-10-CM | POA: Diagnosis not present

## 2019-11-13 DIAGNOSIS — Z0000 Encounter for general adult medical examination without abnormal findings: Secondary | ICD-10-CM | POA: Diagnosis not present

## 2019-11-13 DIAGNOSIS — Z1329 Encounter for screening for other suspected endocrine disorder: Secondary | ICD-10-CM | POA: Diagnosis not present

## 2019-11-13 DIAGNOSIS — E6609 Other obesity due to excess calories: Secondary | ICD-10-CM | POA: Diagnosis not present

## 2019-11-13 DIAGNOSIS — Z13220 Encounter for screening for lipoid disorders: Secondary | ICD-10-CM | POA: Diagnosis not present

## 2019-12-01 DIAGNOSIS — Z13220 Encounter for screening for lipoid disorders: Secondary | ICD-10-CM | POA: Diagnosis not present

## 2019-12-01 DIAGNOSIS — E6609 Other obesity due to excess calories: Secondary | ICD-10-CM | POA: Diagnosis not present

## 2019-12-01 DIAGNOSIS — Z1329 Encounter for screening for other suspected endocrine disorder: Secondary | ICD-10-CM | POA: Diagnosis not present

## 2019-12-01 DIAGNOSIS — Z0000 Encounter for general adult medical examination without abnormal findings: Secondary | ICD-10-CM | POA: Diagnosis not present

## 2019-12-04 DIAGNOSIS — Z6836 Body mass index (BMI) 36.0-36.9, adult: Secondary | ICD-10-CM | POA: Diagnosis not present

## 2019-12-04 DIAGNOSIS — F419 Anxiety disorder, unspecified: Secondary | ICD-10-CM | POA: Diagnosis not present

## 2019-12-04 DIAGNOSIS — Z0001 Encounter for general adult medical examination with abnormal findings: Secondary | ICD-10-CM | POA: Diagnosis not present

## 2020-05-16 DIAGNOSIS — R531 Weakness: Secondary | ICD-10-CM | POA: Diagnosis not present

## 2020-05-16 DIAGNOSIS — M7918 Myalgia, other site: Secondary | ICD-10-CM | POA: Diagnosis not present

## 2020-05-16 DIAGNOSIS — R509 Fever, unspecified: Secondary | ICD-10-CM | POA: Diagnosis not present

## 2020-05-16 DIAGNOSIS — Z1159 Encounter for screening for other viral diseases: Secondary | ICD-10-CM | POA: Diagnosis not present

## 2020-05-23 ENCOUNTER — Other Ambulatory Visit: Payer: Self-pay

## 2020-08-15 DIAGNOSIS — R059 Cough, unspecified: Secondary | ICD-10-CM | POA: Diagnosis not present

## 2020-08-15 DIAGNOSIS — Z1152 Encounter for screening for COVID-19: Secondary | ICD-10-CM | POA: Diagnosis not present

## 2020-08-15 DIAGNOSIS — J309 Allergic rhinitis, unspecified: Secondary | ICD-10-CM | POA: Diagnosis not present

## 2021-05-08 ENCOUNTER — Ambulatory Visit (INDEPENDENT_AMBULATORY_CARE_PROVIDER_SITE_OTHER): Payer: Self-pay

## 2021-05-08 ENCOUNTER — Ambulatory Visit
Admission: RE | Admit: 2021-05-08 | Discharge: 2021-05-08 | Disposition: A | Discharge status: Home / Self Care | Payer: Self-pay | Source: Ambulatory Visit | Attending: Emergency Medicine | Admitting: Emergency Medicine

## 2021-05-08 ENCOUNTER — Other Ambulatory Visit: Payer: Self-pay

## 2021-05-08 VITALS — BP 126/84 | HR 87 | Temp 98.2°F | Resp 20

## 2021-05-08 DIAGNOSIS — M25571 Pain in right ankle and joints of right foot: Secondary | ICD-10-CM

## 2021-05-08 DIAGNOSIS — W11XXXA Fall on and from ladder, initial encounter: Secondary | ICD-10-CM

## 2021-05-08 DIAGNOSIS — S99921A Unspecified injury of right foot, initial encounter: Secondary | ICD-10-CM

## 2021-05-08 DIAGNOSIS — M79671 Pain in right foot: Secondary | ICD-10-CM

## 2021-05-08 MED ORDER — MELOXICAM 15 MG PO TABS: 15.0000 mg | ORAL_TABLET | Freq: Every day | ORAL | 0 refills | Status: DC

## 2021-05-08 NOTE — ED Triage Notes (Signed)
Pt presents with right foot pain from fall from ladder on tuesday, ankle swelling noted, pain is in top of foot

## 2021-05-08 NOTE — Discharge Instructions (Addendum)
X-rays negative for fracture or dislocation Continue conservative management of rest, ice, and elevation Ace warp applied Mobic for pain.  Do not take with lexapro Follow up with PCP if symptoms persist Return or go to the ER if you have any new or worsening symptoms (fever, chills, chest pain, redness, swelling, bruising, deformity, etc...)

## 2021-05-08 NOTE — ED Provider Notes (Signed)
[facility]   [id number] 05/08/21 Arrival Time: 1137  CC: RT foot PAIN  SUBJECTIVE: History from: patient. Tiffany Bass is a 35 y.o. female complains of RT foot pain and injury that occurred 2 days ago.  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  Mis-stepped after climbing down ladder, missed bottom step.  Localizes the pain to the top of foot.  Describes the pain as intermittent.  Has tried OTC medications without relief.  Symptoms are made worse with extending toes.  Reports hx of stress fracture on other foot.  Denies fever, chills, erythema, ecchymosis, effusion, weakness, numbness and tingling.  ROS: As per HPI.  All other pertinent ROS negative.     Past Medical History:  Diagnosis Date   Anxiety    Medical history non-contributory    Pregnant 06/19/2014   Supervision of normal pregnancy in first trimester 06/29/2014    [facility]  Tiffany Bass 35 yo wm 1st Dating By [place] Pap 10/06/12 GC/CT Initial:                36+wks: Genetic Screen NT/IT:  CF screen  Anatomic [place]  Flu vaccine  Tdap Recommended ~ 28wks Glucose Screen  2 hr GBS  Feed Preference  Contraception  Circumcision  Childbirth Classes  Pediatrician     Past Surgical History:  Procedure Laterality Date   CHOLECYSTECTOMY     No Known Allergies No current facility-administered medications on file prior to encounter.   Current Outpatient Medications on File Prior to Encounter  Medication Sig Dispense Refill   escitalopram (LEXAPRO) 10 MG tablet Take 10 mg by mouth daily.  12   Social History   Socioeconomic History   Marital status: Married    Spouse name: Not on file   Number of children: Not on file   Years of education: Not on file   Highest education level: Not on file  Occupational History   Not on file  Tobacco Use   Smoking status: Never   Smokeless tobacco: Never  Substance and Sexual Activity   Alcohol use: No   Drug use: No   Sexual activity: Yes     Birth control/protection: None  Other Topics Concern   Not on file  Social History Narrative   Not on file   Social Determinants of Health   Financial Resource Strain: Not on file  Food Insecurity: Not on file  Transportation Needs: Not on file  Physical Activity: Not on file  Stress: Not on file  Social Connections: Not on file  Intimate Partner Violence: Not on file   Family History  Problem Relation Age of Onset   Diabetes Paternal Grandmother    COPD Father    Asthma Father    Hypertension Mother    Emphysema Paternal Grandfather     OBJECTIVE:  Vitals:   05/08/21 1148  BP: 126/84  Pulse: 87  Resp: 20  Temp: 98.2 F (36.8 C)  SpO2: 98%    General appearance: ALERT; in no acute distress.  Head: NCAT Lungs: Normal respiratory effort CV: Dorsalis pedis pulse 2+ Musculoskeletal: RT foot Inspection: Skin warm, dry, clear and intact without obvious erythema, effusion, or ecchymosis.  Palpation: TTP over distal 1st and 2nd metatarsals Strength: deferred Skin: warm and dry Neurologic: Ambulates without difficulty Psychological: alert and cooperative; normal mood and affect  DIAGNOSTIC STUDIES:  DG Foot Complete Right  Result Date: 05/08/2021 CLINICAL DATA:  Dorsal foot pain after falling off  a ladder on Tuesday. EXAM: RIGHT FOOT COMPLETE - 3+ VIEW COMPARISON:  None. FINDINGS: There is no evidence of fracture or dislocation. There is no evidence of arthropathy or other focal bone abnormality. Soft tissues are unremarkable. IMPRESSION: Negative. Electronically Signed   By: Obie Dredge M.D.   On: 05/08/2021 12:30     ASSESSMENT & PLAN:  1. Right foot pain   2. Injury of right foot, initial encounter    Meds ordered this encounter  Medications   meloxicam (MOBIC) 15 MG tablet    Sig: Take 1 tablet (15 mg total) by mouth daily.    Dispense:  20 tablet    Refill:  0    Order Specific Question:   Supervising Provider    Answer:   Eustace Moore [[id number]]    X-rays negative for fracture or dislocation Continue conservative management of rest, ice, and elevation Ace warp applied Mobic for pain Follow up with PCP if symptoms persist Return or go to the ER if you have any new or worsening symptoms (fever, chills, chest pain, redness, swelling, bruising, deformity, etc...)   Reviewed expectations re: course of current medical issues. Questions answered. Outlined signs and symptoms indicating need for more acute intervention. Patient verbalized understanding. After Visit Summary given.     Rennis Harding, PA-C 05/08/21 1239

## 2022-03-13 ENCOUNTER — Other Ambulatory Visit: Payer: Self-pay | Admitting: Family Medicine

## 2022-03-13 ENCOUNTER — Other Ambulatory Visit (HOSPITAL_COMMUNITY): Payer: Self-pay | Admitting: Family Medicine

## 2022-03-13 DIAGNOSIS — R945 Abnormal results of liver function studies: Secondary | ICD-10-CM

## 2022-03-23 ENCOUNTER — Ambulatory Visit (HOSPITAL_COMMUNITY)
Admission: RE | Admit: 2022-03-23 | Discharge: 2022-03-23 | Disposition: A | Discharge status: Home / Self Care | Payer: 59 | Source: Ambulatory Visit | Attending: Family Medicine | Admitting: Family Medicine

## 2022-03-23 DIAGNOSIS — R945 Abnormal results of liver function studies: Secondary | ICD-10-CM | POA: Diagnosis not present

## 2023-07-18 IMAGING — DX DG FOOT COMPLETE 3+V*R*
3 series · 3 of 3 positions shown · non-contrast
Comparison: None.

CLINICAL DATA: Dorsal foot pain after falling off a ladder on
[REDACTED].

EXAM:
RIGHT FOOT COMPLETE - 3+ VIEW

[foot ap]
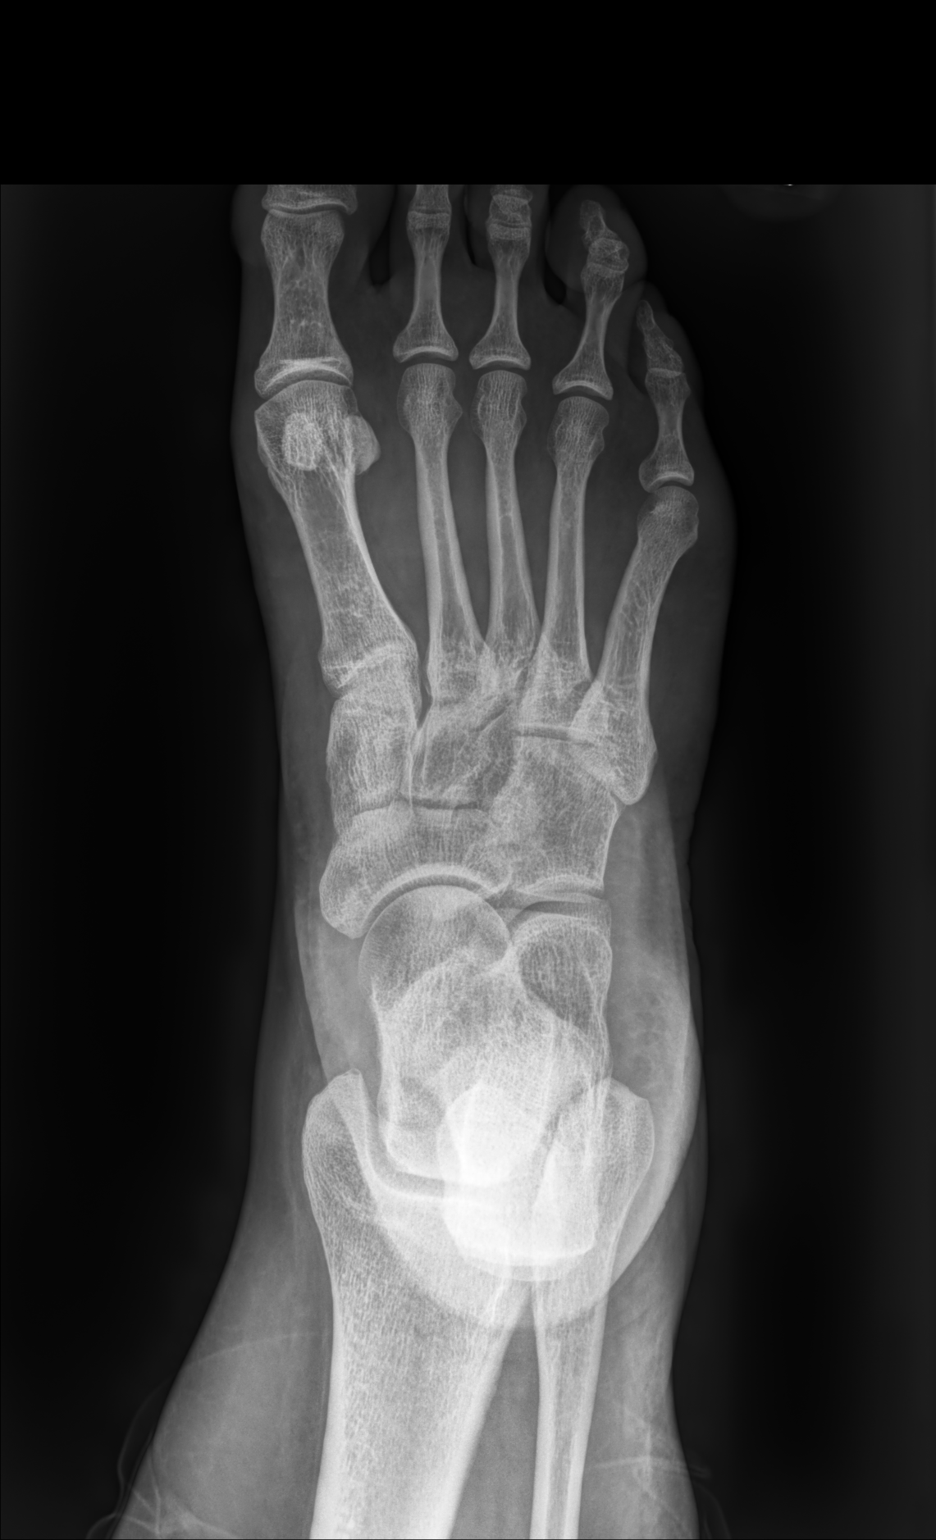

[foot mlo]
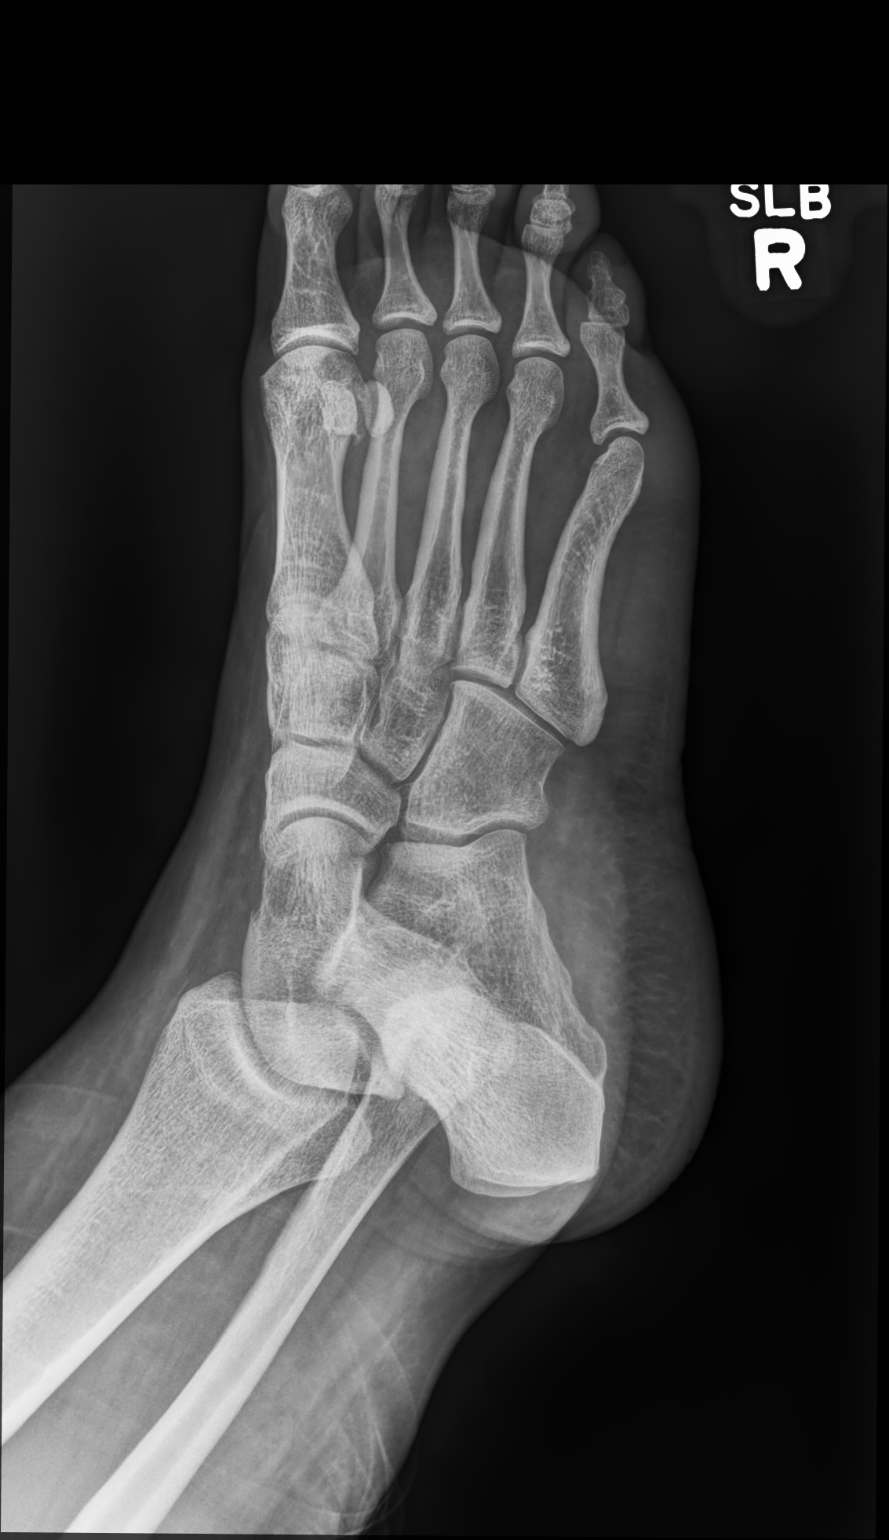

[foot lat]
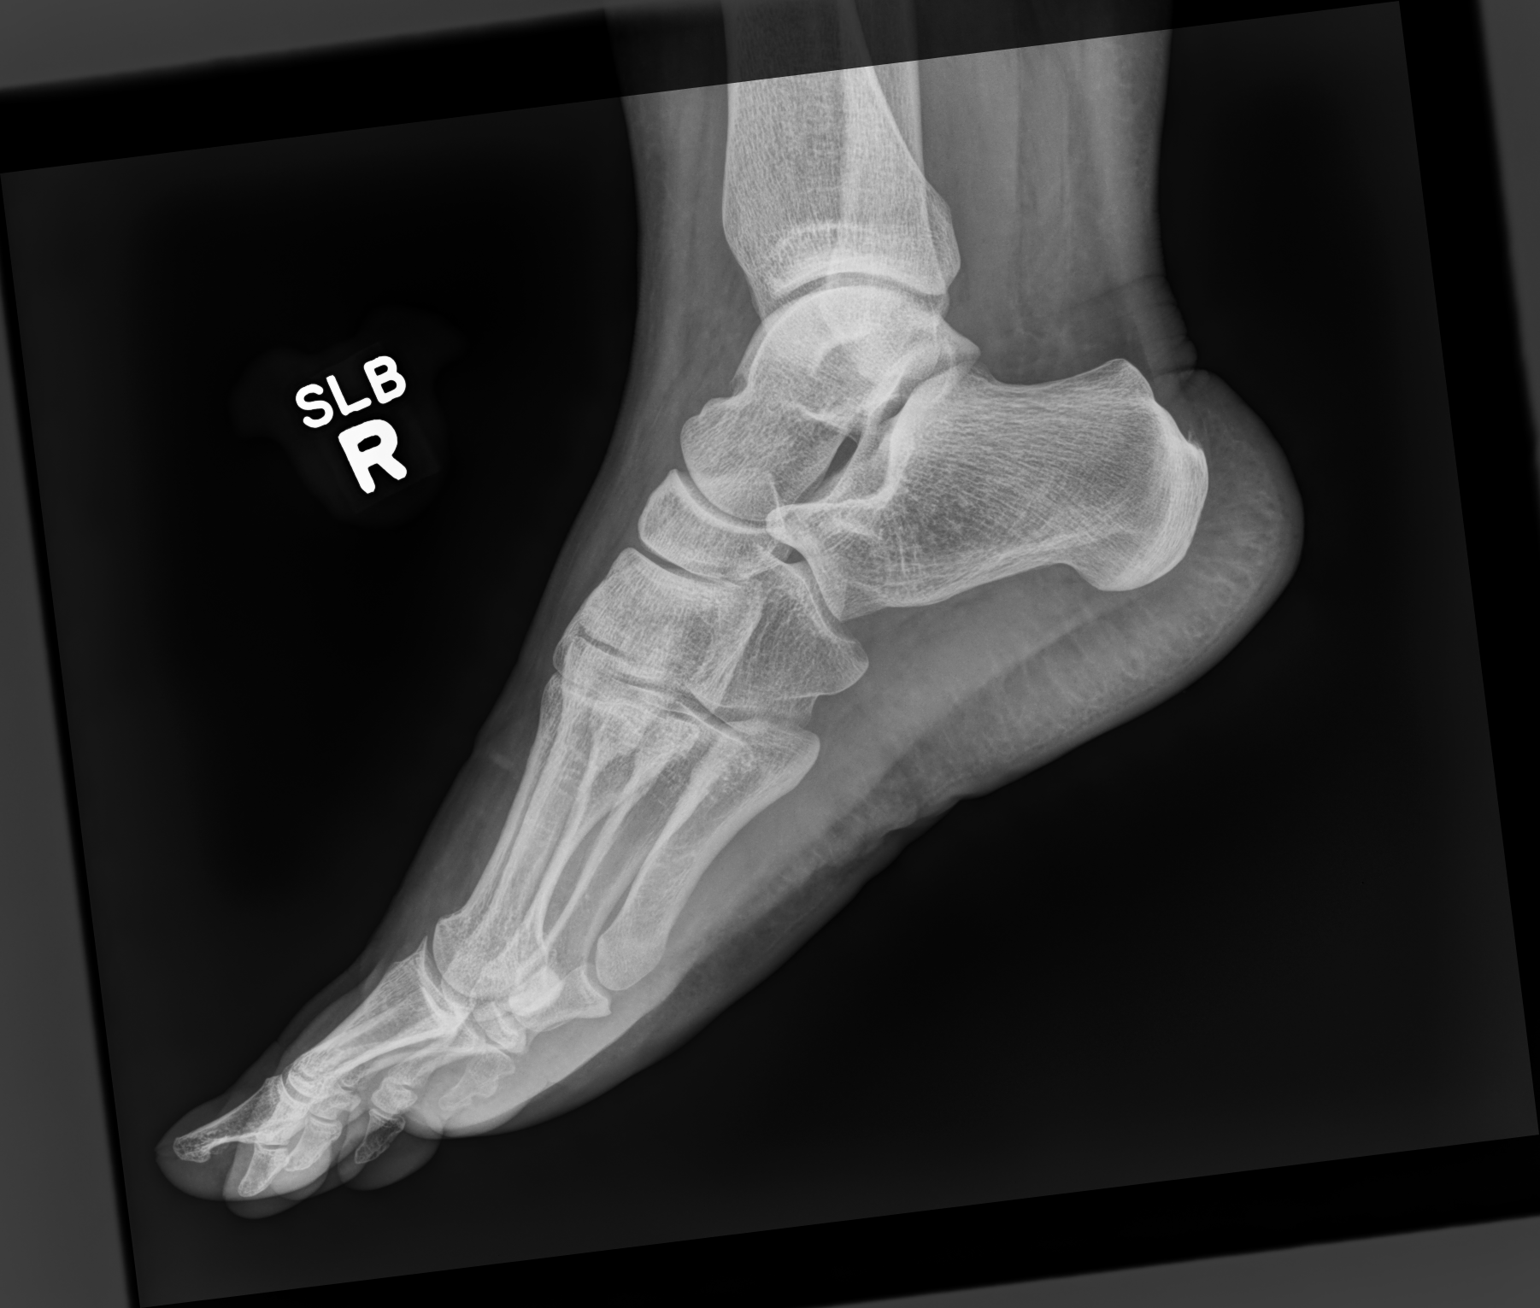

[3 of 3 positions shown; findings below may reference images not displayed]

FINDINGS: There is no evidence of fracture or dislocation. There is no
evidence of arthropathy or other focal bone abnormality. Soft
tissues are unremarkable.
IMPRESSION: Negative.

## 2023-08-10 ENCOUNTER — Ambulatory Visit
Admission: RE | Admit: 2023-08-10 | Discharge: 2023-08-10 | Disposition: A | Discharge status: Home / Self Care | Payer: 59 | Source: Ambulatory Visit | Attending: Family Medicine | Admitting: Family Medicine

## 2023-08-10 ENCOUNTER — Other Ambulatory Visit: Payer: Self-pay

## 2023-08-10 VITALS — BP 136/66 | HR 104 | Temp 98.3°F | Resp 20

## 2023-08-10 DIAGNOSIS — H6993 Unspecified Eustachian tube disorder, bilateral: Secondary | ICD-10-CM

## 2023-08-10 DIAGNOSIS — H9201 Otalgia, right ear: Secondary | ICD-10-CM

## 2023-08-10 MED ORDER — FLUTICASONE PROPIONATE 50 MCG/ACT NA SUSP: 1.0000 | Freq: Two times a day (BID) | NASAL | 0 refills | Status: DC

## 2023-08-10 MED ORDER — PREDNISONE 50 MG PO TABS: ORAL_TABLET | ORAL | 0 refills | Status: DC

## 2023-08-10 NOTE — ED Triage Notes (Signed)
Pt reports right ear fullness since last night. Reports sinus pressure last week that improved but reports ear pain remained. Denies any known fever, reports intermittent right sided headache and neck pain as well.

## 2023-08-10 NOTE — ED Provider Notes (Signed)
[facility]    CSN: [id number] Arrival date & time: 08/10/23  1745      History   Chief Complaint Chief Complaint  Patient presents with   Ear Fullness    Pain in ear and neck/jaw. - Entered by patient    HPI Tiffany Bass is a 37 y.o. female.   Patient presenting today with 1 day history of right ear pain and pressure.  States she has had some sinus congestion and pressure for the past week that was improving on Sudafed.  Denies fever, chills, drainage from the ear, loss of hearing, nausea, vomiting.  Taking ibuprofen for the ear pain with mild temporary benefit.    Past Medical History:  Diagnosis Date   Anxiety    Medical history non-contributory    Pregnant 06/19/2014   Supervision of normal pregnancy in first trimester 06/29/2014    [facility]  Teckla Pheng 37 yo wm 1st Dating By [place] Pap 10/06/12 GC/CT Initial:                36+wks: Genetic Screen NT/IT:  CF screen  Anatomic [place]  Flu vaccine  Tdap Recommended ~ 28wks Glucose Screen  2 hr GBS  Feed Preference  Contraception  Circumcision  Childbirth Classes  Pediatrician      Patient Active Problem List   Diagnosis Date Noted   Gestational hypertension 01/13/2015    Past Surgical History:  Procedure Laterality Date   CHOLECYSTECTOMY      OB History     Gravida  1   Para  1   Term  1   Preterm      AB      Living  1      SAB      IAB      Ectopic      Multiple  0   Live Births  1            Home Medications    Prior to Admission medications   Medication Sig Start Date End Date Taking? Authorizing Provider  fluticasone (FLONASE) 50 MCG/ACT nasal spray Place 1 spray into both nostrils 2 (two) times daily. 08/10/23  Yes Particia Nearing, PA-C  predniSONE (DELTASONE) 50 MG tablet Take 1 tab daily with breakfast for 3 days 08/10/23  Yes Particia Nearing, PA-C  escitalopram (LEXAPRO) 10 MG tablet Take 10 mg by mouth daily. 02/17/17   [provider]  meloxicam (MOBIC) 15 MG tablet Take 1 tablet (15 mg total) by mouth daily. 05/08/21   Rennis Harding, PA-C    Family History Family History  Problem Relation Age of Onset   Diabetes Paternal Grandmother    COPD Father    Asthma Father    Hypertension Mother    Emphysema Paternal Grandfather     Social History Social History   Tobacco Use   Smoking status: Never   Smokeless tobacco: Never  Substance Use Topics   Alcohol use: No   Drug use: No     Allergies   Patient has no known allergies.   Review of Systems Review of Systems Per HPI  Physical Exam Triage Vital Signs ED Triage Vitals  Encounter Vitals Group     BP 08/10/23 1836 136/66     Systolic BP Percentile --      Diastolic BP Percentile --      Pulse Rate 08/10/23 1836 (!) 104     Resp 08/10/23 1836 20  Temp 08/10/23 1836 98.3 F (36.8 C)     Temp Source 08/10/23 1836 Oral     SpO2 08/10/23 1836 98 %     Weight --      Height --      Head Circumference --      Peak Flow --      Pain Score 08/10/23 1835 6     Pain Loc --      Pain Education --      Exclude from Growth Chart --    No data found.  Updated Vital Signs BP 136/66 (BP Location: Right Arm)   Pulse (!) 104   Temp 98.3 F (36.8 C) (Oral)   Resp 20   LMP 08/06/2023 (Approximate)   SpO2 98%   Visual Acuity Right Eye Distance:   Left Eye Distance:   Bilateral Distance:    Right Eye Near:   Left Eye Near:    Bilateral Near:     Physical Exam Vitals and nursing note reviewed.  Constitutional:      Appearance: Normal appearance. She is not ill-appearing.  HENT:     Head: Atraumatic.     Ears:     Comments: Bilateral middle ear effusions    Nose: Nose normal.     Mouth/Throat:     Mouth: Mucous membranes are moist.     Pharynx: Oropharynx is clear.  Eyes:     Extraocular Movements: Extraocular movements intact.     Conjunctiva/sclera: Conjunctivae normal.  Cardiovascular:     Rate and Rhythm: Normal  rate and regular rhythm.     Heart sounds: Normal heart sounds.  Pulmonary:     Effort: Pulmonary effort is normal.     Breath sounds: Normal breath sounds.  Musculoskeletal:        General: Normal range of motion.     Cervical back: Normal range of motion and neck supple.  Skin:    General: Skin is warm and dry.  Neurological:     Mental Status: She is alert and oriented to person, place, and time.  Psychiatric:        Mood and Affect: Mood normal.        Thought Content: Thought content normal.        Judgment: Judgment normal.      UC Treatments / Results  Labs (all labs ordered are listed, but only abnormal results are displayed) Labs Reviewed - No data to display  EKG   Radiology No results found.  Procedures Procedures (including critical care time)  Medications Ordered in UC Medications - No data to display  Initial Impression / Assessment and Plan / UC Course  I have reviewed the triage vital signs and the nursing notes.  Pertinent labs & imaging results that were available during my care of the patient were reviewed by me and considered in my medical decision making (see chart for details).     No evidence of a bacterial infection at this time, suspect eustachian tube dysfunction from recent sinus issues.  Treat with short course of prednisone, Flonase, decongestants and supportive home care.  Return for worsening symptoms.  Final Clinical Impressions(s) / UC Diagnoses   Final diagnoses:  Acute dysfunction of Eustachian tube, bilateral  Right ear pain   Discharge Instructions   None    ED Prescriptions     Medication Sig Dispense Auth. Provider   predniSONE (DELTASONE) 50 MG tablet Take 1 tab daily with breakfast for 3 days 3 tablet Maurice March,  Salley Hews, PA-C   fluticasone [facility]) 50 MCG/ACT nasal spray Place 1 spray into both nostrils 2 (two) times daily. 16 g Particia Nearing, [place]      PDMP not reviewed this encounter.   Particia Nearing, [place] 08/10/23 1856

## 2023-09-06 ENCOUNTER — Other Ambulatory Visit: Payer: Self-pay | Admitting: Family Medicine

## 2023-09-16 ENCOUNTER — Ambulatory Visit
Admission: RE | Admit: 2023-09-16 | Discharge: 2023-09-16 | Disposition: A | Discharge status: Home / Self Care | Payer: 59 | Source: Ambulatory Visit | Attending: Family Medicine | Admitting: Family Medicine

## 2023-09-16 ENCOUNTER — Other Ambulatory Visit: Payer: Self-pay

## 2023-09-16 VITALS — BP 131/83 | HR 108 | Temp 98.4°F | Resp 20

## 2023-09-16 DIAGNOSIS — J029 Acute pharyngitis, unspecified: Secondary | ICD-10-CM | POA: Diagnosis present

## 2023-09-16 DIAGNOSIS — J069 Acute upper respiratory infection, unspecified: Secondary | ICD-10-CM | POA: Diagnosis present

## 2023-09-16 DIAGNOSIS — B349 Viral infection, unspecified: Secondary | ICD-10-CM | POA: Insufficient documentation

## 2023-09-16 LAB — POCT RAPID STREP A (OFFICE): Rapid Strep A Screen: NEGATIVE

## 2023-09-16 LAB — POCT INFLUENZA A/B
Influenza A, POC: NEGATIVE
Influenza B, POC: NEGATIVE

## 2023-09-16 MED ORDER — PROMETHAZINE-DM 6.25-15 MG/5ML PO SYRP: 5.0000 mL | ORAL_SOLUTION | Freq: Three times a day (TID) | ORAL | 0 refills | Status: DC | PRN

## 2023-09-16 NOTE — ED Triage Notes (Signed)
 Pt reports generalized body aches, chills, sore throat, weakness, fatigue, headache since yesterday. Intermittent fever. Has been exposed to strep, bronchitis.

## 2023-09-16 NOTE — ED Provider Notes (Signed)
 [facility]    CSN: [id number] Arrival date & time: 09/16/23  1045      History   Chief Complaint Chief Complaint  Patient presents with   Chills    Fever. Shivers. Head ache. Very little sore throat. Body aches. - Entered by patient    HPI Tiffany Bass is a 38 y.o. female.   Patient presents today with a 24-hour history of URI symptoms including body aches, fever, chills, sore throat, generalized malaise, headache, congestion.  She does report a mild cough but this is not significant.  She had some nausea but denies any vomiting, diarrhea, chest pain, shortness of breath.  She has tried Sudafed without improvement of symptoms.  Reports that her son tested positive for strep and her husband has been diagnosed with bronchitis.  Denies additional sick contacts.  She denies any history of allergies, asthma, COPD, smoking.  Denies any recent antibiotics in the past 90 days.  She has had prednisone  at the end of November for an ear problem but denies additional steroid use in the past 90 days.  She is no concern for pregnancy.  She has had COVID vaccines.  She has not had COVID.    Past Medical History:  Diagnosis Date   Anxiety    Medical history non-contributory    Pregnant 06/19/2014   Supervision of normal pregnancy in first trimester 06/29/2014    [facility]  Keeley Sussman 38 yo wm 1st Dating By [place]  Pap 10/06/12 GC/CT Initial:                36+wks: Genetic Screen NT/IT:  CF screen  Anatomic [place]   Flu vaccine  Tdap Recommended ~ 28wks Glucose Screen  2 hr GBS  Feed Preference  Contraception  Circumcision  Childbirth Classes  Pediatrician      Patient Active Problem List   Diagnosis Date Noted   Gestational hypertension 01/13/2015    Past Surgical History:  Procedure Laterality Date   CHOLECYSTECTOMY      OB History     Gravida  1   Para  1   Term  1   Preterm      AB      Living  1      SAB      IAB      Ectopic      Multiple   0   Live Births  1            Home Medications    Prior to Admission medications   Medication Sig Start Date End Date Taking? Authorizing Provider  promethazine -dextromethorphan (PROMETHAZINE -DM) 6.25-15 MG/5ML syrup Take 5 mLs by mouth 3 (three) times daily as needed for cough. 09/16/23  Yes Avari Nevares K, PA-C  escitalopram  (LEXAPRO ) 10 MG tablet Take 10 mg by mouth daily. 02/17/17   [provider]    Family History Family History  Problem Relation Age of Onset   Diabetes Paternal Grandmother    COPD Father    Asthma Father    Hypertension Mother    Emphysema Paternal Grandfather     Social History Social History   Tobacco Use   Smoking status: Never   Smokeless tobacco: Never  Substance Use Topics   Alcohol use: No   Drug use: No     Allergies   Tylenol  [acetaminophen ]   Review of Systems Review of Systems  Constitutional:  Positive for activity change, chills, fatigue and fever. Negative for appetite  change.  HENT:  Positive for congestion and sore throat. Negative for postnasal drip, sinus pressure and sneezing.   Respiratory:  Negative for cough (minimal) and shortness of breath.   Cardiovascular:  Negative for chest pain.  Gastrointestinal:  Positive for nausea. Negative for abdominal pain, diarrhea and vomiting.  Musculoskeletal:  Positive for arthralgias and myalgias.  Neurological:  Positive for headaches. Negative for dizziness and light-headedness.     Physical Exam Triage Vital Signs ED Triage Vitals [09/16/23 1128]  Encounter Vitals Group     BP      Systolic BP Percentile      Diastolic BP Percentile      Pulse      Resp      Temp      Temp src      SpO2      Weight      Height      Head Circumference      Peak Flow      Pain Score 7     Pain Loc      Pain Education      Exclude from Growth Chart    No data found.  Updated Vital Signs BP 131/83 (BP Location: Right Arm)   Pulse (!) 108   Temp 98.4 F (36.9 C)  (Oral)   Resp 20   LMP 09/01/2023   SpO2 98%   Visual Acuity Right Eye Distance:   Left Eye Distance:   Bilateral Distance:    Right Eye Near:   Left Eye Near:    Bilateral Near:     Physical Exam Vitals reviewed.  Constitutional:      General: She is awake. She is not in acute distress.    Appearance: Normal appearance. She is well-developed. She is not ill-appearing.     Comments: Very pleasant female appears stated age in no acute distress sitting comfortably in exam room  HENT:     Head: Normocephalic and atraumatic.     Right Ear: Tympanic membrane, ear canal and external ear normal. Tympanic membrane is not erythematous or bulging.     Left Ear: Tympanic membrane, ear canal and external ear normal. Tympanic membrane is not erythematous or bulging.     Nose:     Right Sinus: No maxillary sinus tenderness or frontal sinus tenderness.     Left Sinus: No maxillary sinus tenderness or frontal sinus tenderness.     Mouth/Throat:     Pharynx: Uvula midline. Postnasal drip present. No oropharyngeal exudate or posterior oropharyngeal erythema.  Cardiovascular:     Rate and Rhythm: Regular rhythm. Tachycardia present.     Heart sounds: Normal heart sounds, S1 normal and S2 normal. No murmur heard. Pulmonary:     Effort: Pulmonary effort is normal.     Breath sounds: Normal breath sounds. No wheezing, rhonchi or rales.     Comments: Clear to auscultation bilaterally Lymphadenopathy:     Head:     Right side of head: No submental, submandibular or tonsillar adenopathy.     Left side of head: No submental, submandibular or tonsillar adenopathy.     Cervical: No cervical adenopathy.  Psychiatric:        Behavior: Behavior is cooperative.      UC Treatments / Results  Labs (all labs ordered are listed, but only abnormal results are displayed) Labs Reviewed  CULTURE, GROUP A STREP [facility])  POCT INFLUENZA A/B  POCT RAPID STREP A (OFFICE)    EKG   Radiology  No results  found.  Procedures Procedures (including critical care time)  Medications Ordered in UC Medications - No data to display  Initial Impression / Assessment and Plan / UC Course  I have reviewed the triage vital signs and the nursing notes.  Pertinent labs & imaging results that were available during my care of the patient were reviewed by me and considered in my medical decision making (see chart for details).     Patient is tachycardic but otherwise well-appearing, afebrile, nontoxic.  No evidence of acute infection that would warrant initiation of antibiotics based on exam today.  Flu and COVID testing were negative.  Strep testing was obtained given her mild sore throat with potential exposure that was negative in clinic.  Will send this for culture but defer antibiotics until culture results are available.  Chest x-ray was deferred as her oxygen saturation was 98% she had no adventitious lung sounds on exam.  We discussed likely viral etiology of symptoms.  She was encouraged to use over-the-counter medications for symptom management.  Recommend that she rest and drink plenty of fluid.  She was given Promethazine  DM for cough and we discussed that this can be sedating so she should not drive or drink alcohol while taking it.  We discussed that if her symptoms are proving within a week she is to return for reevaluation.  If she has any worsening symptoms including high fever not responding to antipyretics, worsening cough, shortness of breath, nausea/vomiting interfering with oral intake, weakness, malaise that she needs to be seen emergently.  Strict return precautions given.  Work excuse note provided.  Final Clinical Impressions(s) / UC Diagnoses   Final diagnoses:  Sore throat  Viral illness  URI with cough and congestion     Discharge Instructions      You tested negative for COVID, flu, strep.  We will send your throat culture off and if this is positive contact you to start  antibiotics.  I believe you have a viral illness.  Continue over-the-counter medications including Mucinex, Flonase , ibuprofen , nasal saline/sinus rinses.  I have sent in Promethazine  DM to help with cough and congestion.  This will make you sleepy so do not drive or drink alcohol while taking it.  Make sure that you rest and drink plenty of fluids.  Follow-up with either our clinic or your primary care next week if your symptoms have not resolved.  If anything worsens and you have high fever not responding to medication, chest pain, shortness of breath, worsening cough, nausea/vomiting interfering with oral intake you need to be seen immediately.     ED Prescriptions     Medication Sig Dispense Auth. Provider   promethazine -dextromethorphan (PROMETHAZINE -DM) 6.25-15 MG/5ML syrup Take 5 mLs by mouth 3 (three) times daily as needed for cough. 118 mL Chinwe Lope K, PA-C      PDMP not reviewed this encounter.   Sherrell Rocky POUR, PA-C 09/16/23 1227

## 2023-09-16 NOTE — Discharge Instructions (Signed)
 You tested negative for COVID, flu, strep.  We will send your throat culture off and if this is positive contact you to start antibiotics.  I believe you have a viral illness.  Continue over-the-counter medications including Mucinex, Flonase , ibuprofen , nasal saline/sinus rinses.  I have sent in Promethazine  DM to help with cough and congestion.  This will make you sleepy so do not drive or drink alcohol while taking it.  Make sure that you rest and drink plenty of fluids.  Follow-up with either our clinic or your primary care next week if your symptoms have not resolved.  If anything worsens and you have high fever not responding to medication, chest pain, shortness of breath, worsening cough, nausea/vomiting interfering with oral intake you need to be seen immediately.

## 2023-09-17 ENCOUNTER — Ambulatory Visit
Admission: EM | Admit: 2023-09-17 | Discharge: 2023-09-17 | Disposition: A | Discharge status: Home / Self Care | Payer: 59 | Attending: Nurse Practitioner | Admitting: Nurse Practitioner

## 2023-09-17 ENCOUNTER — Other Ambulatory Visit: Payer: Self-pay

## 2023-09-17 DIAGNOSIS — B349 Viral infection, unspecified: Secondary | ICD-10-CM | POA: Diagnosis not present

## 2023-09-17 LAB — POCT URINALYSIS DIP (MANUAL ENTRY)
Blood, UA: NEGATIVE
Glucose, UA: NEGATIVE mg/dL
Ketones, POC UA: NEGATIVE mg/dL
Leukocytes, UA: NEGATIVE
Nitrite, UA: NEGATIVE
Protein Ur, POC: 100 mg/dL — AB
Spec Grav, UA: >=1.03 — AB (ref 1.010–1.025)
Urobilinogen, UA: 0.2 U/dL
pH, UA: 5.5 (ref 5.0–8.0)

## 2023-09-17 MED ORDER — ONDANSETRON 4 MG PO TBDP
4.0000 mg | ORAL_TABLET | Freq: Once | ORAL | Status: AC
  Administered 2023-09-17: 4 mg via ORAL

## 2023-09-17 MED ORDER — FLUTICASONE PROPIONATE 50 MCG/ACT NA SUSP: 2.0000 | Freq: Every day | NASAL | 0 refills | Status: DC

## 2023-09-17 MED ORDER — ONDANSETRON 4 MG PO TBDP: 4.0000 mg | ORAL_TABLET | Freq: Three times a day (TID) | ORAL | 0 refills | Status: DC | PRN

## 2023-09-17 NOTE — Discharge Instructions (Addendum)
 As discussed, feel that you are experiencing a viral illness.  Your throat culture is pending.  You will be contacted if the pending test results are abnormal. Your urinalysis shows that you need to increase your fluid intake. Recommend Pedialyte or Gatorlyte to help prevent dehydration. You were given Zofran  4 mg at 9:36 AM.  You can take the next dose in 8 hours. Continue over-the-counter Tylenol  or ibuprofen  as needed for pain, fever, or general discomfort. Recommend a brat diet or a bland diet while nausea and vomiting persist.  This includes bananas, rice, applesauce, toast, chicken noodle soup, crackers, Jell-O, or popsicles. As discussed, a viral illness can persist for up to 7 to 10 days.  If you experience worsening nausea, vomiting, increased weakness, or other concerns, please follow-up in the emergency department immediately. Follow-up as needed.

## 2023-09-17 NOTE — ED Notes (Signed)
 Pt emesis x1 in room. Provider at bedside and placed order for zofran.  Pt also aware need urine sample.Pt reports unable to pee at this time, ice chips post zofran admin trialed.

## 2023-09-17 NOTE — ED Provider Notes (Signed)
 [facility]    CSN: [id number] Arrival date & time: 09/17/23  9161      History   Chief Complaint No chief complaint on file.   HPI Tiffany Bass is a 38 y.o. female.   The history is provided by the patient.   Patient returns for nausea, fatigue, worsening ear pain and sore throat.  Patient was seen in this clinic on 09/16/2023 and diagnosed with a viral illness.  COVID/flu test and rapid strep test were all negative.  Patient was prescribed Promethazine  DM for her cough and to help with nausea.  Patient states over the past 24 hours, she has had increased pain in the right ear, and has pain with swallowing.  Patient with increased fatigue and weakness.  Patient states while waiting to be seen today, she felt weak, felt that she passed out.  Patient later states that she was in the restroom, and found herself leaning against the door after she had a vomiting episode.  Patient denies new fever, chills, headache, ear drainage, wheezing, difficulty breathing, chest pain, abdominal pain, diarrhea, or constipation.  Patient reports that she has been taking promethazine  as prescribed.  States that she has not eaten since yesterday after her appointment.  States that she ate [name] at that time.  Past Medical History:  Diagnosis Date   Anxiety    Medical history non-contributory    Pregnant 06/19/2014   Supervision of normal pregnancy in first trimester 06/29/2014    [facility]  Tiffany Bass 38 yo wm 1st Dating By US  Pap 10/06/12 GC/CT Initial:                36+wks: Genetic Screen NT/IT:  CF screen  Anatomic US   Flu vaccine  Tdap Recommended ~ 28wks Glucose Screen  2 hr GBS  Feed Preference  Contraception  Circumcision  Childbirth Classes  Pediatrician      Patient Active Problem List   Diagnosis Date Noted   Gestational hypertension 01/13/2015    Past Surgical History:  Procedure Laterality Date   CHOLECYSTECTOMY      OB History     Gravida  1    Para  1   Term  1   Preterm      AB      Living  1      SAB      IAB      Ectopic      Multiple  0   Live Births  1            Home Medications    Prior to Admission medications   Medication Sig Start Date End Date Taking? Authorizing Provider  fluticasone  (FLONASE ) 50 MCG/ACT nasal spray Place 2 sprays into both nostrils daily. 09/17/23  Yes Leath-Warren, Etta PARAS, NP  ondansetron  (ZOFRAN -ODT) 4 MG disintegrating tablet Take 1 tablet (4 mg total) by mouth every 8 (eight) hours as needed. 09/17/23  Yes Leath-Warren, Etta PARAS, NP  escitalopram  (LEXAPRO ) 10 MG tablet Take 10 mg by mouth daily. 02/17/17   [provider]  promethazine -dextromethorphan (PROMETHAZINE -DM) 6.25-15 MG/5ML syrup Take 5 mLs by mouth 3 (three) times daily as needed for cough. 09/16/23   Raspet, Rocky POUR, PA-C    Family History Family History  Problem Relation Age of Onset   Diabetes Paternal Grandmother    COPD Father    Asthma Father    Hypertension Mother    Emphysema Paternal Grandfather  Social History Social History   Tobacco Use   Smoking status: Never   Smokeless tobacco: Never  Substance Use Topics   Alcohol use: No   Drug use: No     Allergies   Tylenol  [acetaminophen ]   Review of Systems Review of Systems Per HPI  Physical Exam Triage Vital Signs ED Triage Vitals [09/17/23 0840]  Encounter Vitals Group     BP 116/72     Systolic BP Percentile      Diastolic BP Percentile      Pulse Rate 100     Resp 19     Temp      Temp src      SpO2 97 %     Weight      Height      Head Circumference      Peak Flow      Pain Score 10     Pain Loc      Pain Education      Exclude from Growth Chart    No data found.  Updated Vital Signs BP 116/72 (BP Location: Right Arm)   Pulse 100   Resp 19   LMP 09/01/2023   SpO2 97%   Visual Acuity Right Eye Distance:   Left Eye Distance:   Bilateral Distance:    Right Eye Near:   Left Eye Near:     Bilateral Near:     Physical Exam Vitals and nursing note reviewed.  Constitutional:      General: She is not in acute distress.    Appearance: Normal appearance. She is ill-appearing.  HENT:     Head: Normocephalic.     Right Ear: Ear canal and external ear normal. A middle ear effusion is present.     Left Ear: Ear canal and external ear normal. A middle ear effusion is present.     Nose: Nose normal.     Right Turbinates: Enlarged and swollen.     Left Turbinates: Enlarged and swollen.     Right Sinus: No maxillary sinus tenderness or frontal sinus tenderness.     Left Sinus: No maxillary sinus tenderness or frontal sinus tenderness.     Mouth/Throat:     Lips: Pink.     Mouth: Mucous membranes are moist.     Pharynx: Uvula midline. Posterior oropharyngeal erythema and postnasal drip present. No pharyngeal swelling, oropharyngeal exudate or uvula swelling.  Eyes:     Extraocular Movements: Extraocular movements intact.     Conjunctiva/sclera: Conjunctivae normal.     Pupils: Pupils are equal, round, and reactive to light.  Cardiovascular:     Rate and Rhythm: Normal rate and regular rhythm.     Pulses: Normal pulses.     Heart sounds: Normal heart sounds.  Pulmonary:     Effort: Pulmonary effort is normal. No respiratory distress.     Breath sounds: Normal breath sounds. No stridor. No wheezing, rhonchi or rales.  Abdominal:     General: Bowel sounds are normal.     Palpations: Abdomen is soft.     Tenderness: There is no abdominal tenderness.  Musculoskeletal:     Cervical back: Normal range of motion.  Lymphadenopathy:     Cervical: No cervical adenopathy.  Skin:    General: Skin is warm and dry.  Neurological:     General: No focal deficit present.     Mental Status: She is alert and oriented to person, place, and time.  Psychiatric:  Mood and Affect: Mood normal.        Behavior: Behavior normal.      UC Treatments / Results  Labs (all labs ordered  are listed, but only abnormal results are displayed) Labs Reviewed  POCT URINALYSIS DIP (MANUAL ENTRY)    EKG: Normal sinus rhythm, no ectopy, no STEMI.  No other EKGs available for comparison.   Radiology No results found.  Procedures Procedures (including critical care time)  Medications Ordered in UC Medications  ondansetron  (ZOFRAN -ODT) disintegrating tablet 4 mg (4 mg Oral Given 09/17/23 0936)    Initial Impression / Assessment and Plan / UC Course  I have reviewed the triage vital signs and the nursing notes.  Pertinent labs & imaging results that were available during my care of the patient were reviewed by me and considered in my medical decision making (see chart for details).  Patient with increased weakness and fatigue over the past 24 hours along with nausea and right ear pain.  EKG performed as patient reported possible syncopal episode.  EKG shows normal sinus rhythm, no ectopy.  Urinalysis shows increased specific gravity, no signs of infection.  Continue to agree with previous diagnosis of viral illness.  Ondansetron  4 mg ODT prescribed for nausea, and fluticasone  50 mcg nasal spray prescribed for bilateral middle ear effusion.  Per review of the patient's chart, patient with bilateral eustachian tube dysfunction at the end of October.  Supportive care recommendations were provided and discussed with the patient to include continuing over-the-counter analgesics, fluids, a brat diet, and allowing for plenty of rest.  Patient was advised to go to the emergency department immediately if she experiences worsening nausea, vomiting, or other concerns.  Patient was in agreement with this plan of care and verbalized understanding.  All questions were answered.  Patient stable for discharge.  Final Clinical Impressions(s) / UC Diagnoses   Final diagnoses:  Viral illness     Discharge Instructions      As discussed, feel that you are experiencing a viral illness.  Your throat  culture is pending.  You will be contacted if the pending test results are abnormal. You were given Zofran  4 mg here in the clinic.  You can take the next dose     ED Prescriptions     Medication Sig Dispense Auth. Provider   ondansetron  (ZOFRAN -ODT) 4 MG disintegrating tablet Take 1 tablet (4 mg total) by mouth every 8 (eight) hours as needed. 20 tablet Leath-Warren, Etta PARAS, NP   fluticasone  (FLONASE ) 50 MCG/ACT nasal spray Place 2 sprays into both nostrils daily. 16 g Leath-Warren, Etta PARAS, NP      PDMP not reviewed this encounter.   Gilmer Etta PARAS, NP 09/17/23 1030

## 2023-09-17 NOTE — ED Notes (Signed)
 Per provider, pt to call ride. Pt reported will call dad to pick up at discharge.

## 2023-09-17 NOTE — ED Notes (Signed)
 Pt reports nausea has improved. Tolerating PO fluids.

## 2023-09-17 NOTE — ED Triage Notes (Signed)
 Pt reports being seen yesterday, for cold [place]'s headache body aches  cough congestion. Was tested for COVID FLU and strep all we re negative. While waiting to be checked in patient passed out in line.

## 2023-09-18 NOTE — Progress Notes (Signed)
 Pt did not show for visit [facility]

## 2023-09-19 LAB — CULTURE, GROUP A STREP (THRC)

## 2023-12-16 ENCOUNTER — Ambulatory Visit: Payer: Self-pay

## 2023-12-19 ENCOUNTER — Ambulatory Visit
Admission: RE | Admit: 2023-12-19 | Discharge: 2023-12-19 | Disposition: A | Discharge status: Home / Self Care | Source: Ambulatory Visit | Attending: Family Medicine | Admitting: Family Medicine

## 2023-12-19 VITALS — BP 137/84 | HR 99 | Temp 97.9°F | Resp 18

## 2023-12-19 DIAGNOSIS — N760 Acute vaginitis: Secondary | ICD-10-CM | POA: Insufficient documentation

## 2023-12-19 LAB — POCT URINALYSIS DIP (MANUAL ENTRY)
Bilirubin, UA: NEGATIVE
Blood, UA: NEGATIVE
Glucose, UA: NEGATIVE mg/dL
Leukocytes, UA: NEGATIVE
Nitrite, UA: NEGATIVE
Spec Grav, UA: 1.02 (ref 1.010–1.025)
Urobilinogen, UA: 0.2 U/dL
pH, UA: 7 (ref 5.0–8.0)

## 2023-12-19 NOTE — Discharge Instructions (Signed)
 We will let you know if anything comes back abnormal on your vaginal swab.  In the meantime, you may start a female health probiotic daily as well as boric acid suppositories either daily or as needed.

## 2023-12-19 NOTE — ED Triage Notes (Signed)
 Pt reports abnormal discharge , since Monday, that cleared up now patient has vaginal discomfort post urination.

## 2023-12-19 NOTE — ED Provider Notes (Signed)
 [facility]    CSN: [id number] Arrival date & time: 12/19/23  1246      History   Chief Complaint Chief Complaint  Patient presents with   Vaginal Discharge    Discharge and discomfort. No odor and no burning, but slight pain. - Entered by patient    HPI Tiffany Bass is a 38 y.o. female.   Patient presenting today with about a week of intermittent thick off-white vaginal discharge, suprapubic discomfort after urination.  Denies fever, chills, hematuria, dysuria, nausea, vomiting, concern for STIs or pregnancy.  So far not trying anything over-the-counter for symptoms.  Last menstrual period was 12/03/2023.    Past Medical History:  Diagnosis Date   Anxiety    Medical history non-contributory    Pregnant 06/19/2014   Supervision of normal pregnancy in first trimester 06/29/2014    [facility]  Lauran Romanski 38 yo wm 1st Dating By [place] Pap 10/06/12 GC/CT Initial:                36+wks: Genetic Screen NT/IT:  CF screen  Anatomic [place]  Flu vaccine  Tdap Recommended ~ 28wks Glucose Screen  2 hr GBS  Feed Preference  Contraception  Circumcision  Childbirth Classes  Pediatrician      Patient Active Problem List   Diagnosis Date Noted   Gestational hypertension 01/13/2015    Past Surgical History:  Procedure Laterality Date   CHOLECYSTECTOMY      OB History     Gravida  1   Para  1   Term  1   Preterm      AB      Living  1      SAB      IAB      Ectopic      Multiple  0   Live Births  1            Home Medications    Prior to Admission medications   Medication Sig Start Date End Date Taking? Authorizing Provider  escitalopram (LEXAPRO) 10 MG tablet Take 10 mg by mouth daily. 02/17/17   [provider]  fluticasone (FLONASE) 50 MCG/ACT nasal spray Place 2 sprays into both nostrils daily. 09/17/23   Leath-Warren, Sadie Haber, NP  ondansetron (ZOFRAN-ODT) 4 MG disintegrating tablet Take 1 tablet (4 mg total) by mouth  every 8 (eight) hours as needed. 09/17/23   Leath-Warren, Sadie Haber, NP  promethazine-dextromethorphan (PROMETHAZINE-DM) 6.25-15 MG/5ML syrup Take 5 mLs by mouth 3 (three) times daily as needed for cough. 09/16/23   Raspet, Noberto Retort, PA-C    Family History Family History  Problem Relation Age of Onset   Diabetes Paternal Grandmother    COPD Father    Asthma Father    Hypertension Mother    Emphysema Paternal Grandfather     Social History Social History   Tobacco Use   Smoking status: Never   Smokeless tobacco: Never  Substance Use Topics   Alcohol use: No   Drug use: No     Allergies   Tylenol [acetaminophen]   Review of Systems Review of Systems Per HPI  Physical Exam Triage Vital Signs ED Triage Vitals  Encounter Vitals Group     BP 12/19/23 1252 137/84     Systolic BP Percentile --      Diastolic BP Percentile --      Pulse Rate 12/19/23 1252 99     Resp 12/19/23 1252 18  Temp 12/19/23 1252 97.9 F (36.6 C)     Temp Source 12/19/23 1252 Oral     SpO2 12/19/23 1252 98 %     Weight --      Height --      Head Circumference --      Peak Flow --      Pain Score 12/19/23 1255 0     Pain Loc --      Pain Education --      Exclude from Growth Chart --    No data found.  Updated Vital Signs BP 137/84 (BP Location: Right Arm)   Pulse 99   Temp 97.9 F (36.6 C) (Oral)   Resp 18   LMP 12/03/2023 (Exact Date)   SpO2 98%   Visual Acuity Right Eye Distance:   Left Eye Distance:   Bilateral Distance:    Right Eye Near:   Left Eye Near:    Bilateral Near:     Physical Exam Vitals and nursing note reviewed.  Constitutional:      Appearance: Normal appearance. She is not ill-appearing.  HENT:     Head: Atraumatic.  Eyes:     Extraocular Movements: Extraocular movements intact.     Conjunctiva/sclera: Conjunctivae normal.  Cardiovascular:     Rate and Rhythm: Normal rate.  Pulmonary:     Effort: Pulmonary effort is normal.  Abdominal:      General: Bowel sounds are normal. There is no distension.     Palpations: Abdomen is soft.     Tenderness: There is no abdominal tenderness. There is no right CVA tenderness, left CVA tenderness or guarding.  Genitourinary:    Comments: GU exam deferred, self swab performed Musculoskeletal:        General: Normal range of motion.     Cervical back: Normal range of motion and neck supple.  Skin:    General: Skin is warm and dry.  Neurological:     Mental Status: She is alert and oriented to person, place, and time.  Psychiatric:        Mood and Affect: Mood normal.        Thought Content: Thought content normal.        Judgment: Judgment normal.      UC Treatments / Results  Labs (all labs ordered are listed, but only abnormal results are displayed) Labs Reviewed  POCT URINALYSIS DIP (MANUAL ENTRY) - Abnormal; Notable for the following components:      Result Value   Ketones, POC UA trace (5) (*)    Protein Ur, POC trace (*)    All other components within normal limits  CERVICOVAGINAL ANCILLARY ONLY    EKG   Radiology No results found.  Procedures Procedures (including critical care time)  Medications Ordered in UC Medications - No data to display  Initial Impression / Assessment and Plan / UC Course  I have reviewed the triage vital signs and the nursing notes.  Pertinent labs & imaging results that were available during my care of the patient were reviewed by me and considered in my medical decision making (see chart for details).     Vitals and exam reassuring today, urinalysis without evidence of urinary tract infection.  Vaginal swab pending, treat based on results.  Discussed supportive over-the-counter medications and home care additionally.  Final Clinical Impressions(s) / UC Diagnoses   Final diagnoses:  Acute vaginitis     Discharge Instructions      We will let you know  if anything comes back abnormal on your vaginal swab.  In the meantime, you  may start a female health probiotic daily as well as boric acid suppositories either daily or as needed.    ED Prescriptions   None    PDMP not reviewed this encounter.   Particia Nearing, [place] 12/19/23 1315

## 2023-12-20 ENCOUNTER — Telehealth (HOSPITAL_COMMUNITY): Payer: Self-pay

## 2023-12-20 LAB — CERVICOVAGINAL ANCILLARY ONLY
Bacterial Vaginitis (gardnerella): POSITIVE — AB
Candida Glabrata: NEGATIVE
Candida Vaginitis: POSITIVE — AB
Chlamydia: NEGATIVE
Comment: NEGATIVE
Comment: NEGATIVE
Comment: NEGATIVE
Comment: NEGATIVE
Comment: NEGATIVE
Comment: NORMAL
Neisseria Gonorrhea: NEGATIVE
Trichomonas: NEGATIVE

## 2023-12-20 MED ORDER — METRONIDAZOLE 500 MG PO TABS
500.0000 mg | ORAL_TABLET | Freq: Two times a day (BID) | ORAL | 0 refills | Status: AC
Start: 2023-12-20 — End: 2023-12-27

## 2023-12-20 MED ORDER — FLUCONAZOLE 150 MG PO TABS
150.0000 mg | ORAL_TABLET | Freq: Once | ORAL | 0 refills | Status: AC
Start: 2023-12-20 — End: 2023-12-20

## 2023-12-20 NOTE — Telephone Encounter (Signed)
 Per protocol, pt requires tx with metronidazole and Diflucan.  Rx sent to pharmacy on file.

## 2023-12-24 ENCOUNTER — Encounter: Payer: Self-pay | Admitting: Urgent Care

## 2023-12-24 ENCOUNTER — Ambulatory Visit (INDEPENDENT_AMBULATORY_CARE_PROVIDER_SITE_OTHER): Admitting: Urgent Care

## 2023-12-24 VITALS — BP 128/77 | HR 82 | Ht 61.0 in | Wt 186.4 lb

## 2023-12-24 DIAGNOSIS — R635 Abnormal weight gain: Secondary | ICD-10-CM | POA: Diagnosis not present

## 2023-12-24 DIAGNOSIS — Z6835 Body mass index (BMI) 35.0-35.9, adult: Secondary | ICD-10-CM

## 2023-12-24 DIAGNOSIS — F418 Other specified anxiety disorders: Secondary | ICD-10-CM

## 2023-12-24 DIAGNOSIS — K760 Fatty (change of) liver, not elsewhere classified: Secondary | ICD-10-CM | POA: Diagnosis not present

## 2023-12-24 MED ORDER — ESCITALOPRAM OXALATE 10 MG PO TABS: 10.0000 mg | ORAL_TABLET | Freq: Every day | ORAL | 3 refills | Status: AC

## 2023-12-24 NOTE — Progress Notes (Signed)
 New Patient Office Visit  Subjective:  Patient ID: Tiffany Bass, female    DOB: [birth year]  Age: 38 y.o. MRN: [id number]  CC:  Chief Complaint  Patient presents with   Establish Care    New pt est care. She was seen urgent care and was told she had fatty liver. She would also like to discuss weight loss options. Pt [facility] [place] for paps.    HPI Tiffany Bass presents to establish care.  Discussed the use of AI scribe software for clinical note transcription with the patient, who gave verbal consent to proceed.  History of Present Illness   Tiffany Bass is a 38 year old female who presents with concerns about abnormal liver labs.  She recently had a urine test at an urgent care that showed trace protein and ketones. She has a history of fatty liver diagnosed in July 2023, with previous lab work indicating abnormal liver function. The last liver function test in the current system was from 2016. An ultrasound in July 2023 confirmed hepatic steatosis. She does not consume alcohol.  She has a history of depression and anxiety, currently managed with escitalopram 10 mg daily since late 2016 or early 2017. She finds this medication helpful for her symptoms, reporting high anxiety and low depression.  She was previously on phentermine for weight loss, which was effective, helping her lose 50 pounds over less than a year. However, she had to discontinue it due to elevated blood pressure. Since stopping phentermine about five to six months ago, she has regained approximately 10 pounds. She is interested in sustainable weight loss options and is currently engaging in physical activities such as walking and using an elliptical machine at home.  She recently completed a course of metronidazole for a vaginal infection, which has resolved. She is also taking a multivitamin with a probiotic.       Outpatient Encounter Medications as of 12/24/2023  Medication Sig    metroNIDAZOLE (FLAGYL) 500 MG tablet Take 1 tablet (500 mg total) by mouth 2 (two) times daily for 7 days.   [DISCONTINUED] escitalopram (LEXAPRO) 10 MG tablet Take 10 mg by mouth daily.   escitalopram (LEXAPRO) 10 MG tablet Take 1 tablet (10 mg total) by mouth daily.   [DISCONTINUED] fluticasone (FLONASE) 50 MCG/ACT nasal spray Place 2 sprays into both nostrils daily. (Patient not taking: Reported on 12/24/2023)   [DISCONTINUED] ondansetron (ZOFRAN-ODT) 4 MG disintegrating tablet Take 1 tablet (4 mg total) by mouth every 8 (eight) hours as needed. (Patient not taking: Reported on 12/24/2023)   [DISCONTINUED] promethazine-dextromethorphan (PROMETHAZINE-DM) 6.25-15 MG/5ML syrup Take 5 mLs by mouth 3 (three) times daily as needed for cough. (Patient not taking: Reported on 12/24/2023)   No facility-administered encounter medications on file as of 12/24/2023.    Past Medical History:  Diagnosis Date   Anxiety    Depression 2017   Medical history non-contributory    Pregnant 06/19/2014   Supervision of normal pregnancy in first trimester 06/29/2014    [facility] FOB  Latona Krichbaum 38 yo wm 1st Dating By US  Pap 10/06/12 GC/CT Initial:                36+wks: Genetic Screen NT/IT:  CF screen  Anatomic US   Flu vaccine  Tdap Recommended ~ 28wks Glucose Screen  2 hr GBS  Feed Preference  Contraception  Circumcision  Childbirth Classes  Pediatrician      Past Surgical History:  Procedure Laterality Date   CHOLECYSTECTOMY  2011    Family History  Problem Relation Age of Onset   Diabetes Paternal Grandmother    COPD Father    Asthma Father    Hypertension Mother    Anxiety disorder Mother    Depression Mother    Emphysema Paternal Grandfather    Anxiety disorder Maternal Grandmother    Vision loss Maternal Grandmother    Cancer Paternal Aunt     Social History   Socioeconomic History   Marital status: Married    Spouse name: Not on file   Number of children: Not on file   Years of  education: Not on file   Highest education level: Some college, no degree  Occupational History   Not on file  Tobacco Use   Smoking status: Never   Smokeless tobacco: Never  Substance and Sexual Activity   Alcohol use: No   Drug use: No   Sexual activity: Yes    Birth control/protection: Condom, None  Other Topics Concern   Not on file  Social History Narrative   Not on file   Social Drivers of Health   Financial Resource Strain: Low Risk  (12/19/2023)   Overall Financial Resource Strain (CARDIA)    Difficulty of Paying Living Expenses: Not hard at all  Food Insecurity: No Food Insecurity (12/19/2023)   Hunger Vital Sign    Worried About Running Out of Food in the Last Year: Never true    Ran Out of Food in the Last Year: Never true  Transportation Needs: No Transportation Needs (12/19/2023)   PRAPARE - Administrator, Civil Service (Medical): No    Lack of Transportation (Non-Medical): No  Physical Activity: Insufficiently Active (12/19/2023)   Exercise Vital Sign    Days of Exercise per Week: 3 days    Minutes of Exercise per Session: 30 min  Stress: Stress Concern Present (12/19/2023)   [employer] of Occupational Health - Occupational Stress Questionnaire    Feeling of Stress : Rather much  Social Connections: Moderately Integrated (12/19/2023)   Social Connection and Isolation Panel [NHANES]    Frequency of Communication with Friends and Family: More than three times a week    Frequency of Social Gatherings with Friends and Family: Twice a week    Attends Religious Services: 1 to 4 times per year    Active Member of [employer] or Organizations: No    Attends Engineer, structural: Not on file    Marital Status: Married  Catering manager Violence: Not on file    ROS: as noted in HPI  Objective:  BP 128/77   Pulse 82   Ht 5\' 1"  (1.549 m)   Wt 186 lb 6.4 oz (84.6 kg)   LMP 12/03/2023 (Exact Date)   SpO2 100%   BMI 35.22 kg/m   Physical  Exam Vitals and nursing note reviewed.  Constitutional:      General: She is not in acute distress.    Appearance: Normal appearance. She is obese. She is not ill-appearing, toxic-appearing or diaphoretic.  HENT:     Head: Normocephalic and atraumatic.     Right Ear: Tympanic membrane, ear canal and external ear normal. There is no impacted cerumen.     Left Ear: Tympanic membrane, ear canal and external ear normal. There is no impacted cerumen.     Nose: Nose normal.     Mouth/Throat:     Mouth: Mucous membranes are moist.  Pharynx: Oropharynx is clear. No oropharyngeal exudate or posterior oropharyngeal erythema.  Eyes:     General: No scleral icterus.       Right eye: No discharge.        Left eye: No discharge.     Extraocular Movements: Extraocular movements intact.     Pupils: Pupils are equal, round, and reactive to light.  Neck:     Thyroid: No thyroid mass, thyromegaly or thyroid tenderness.  Cardiovascular:     Rate and Rhythm: Normal rate and regular rhythm.     Pulses: Normal pulses.     Heart sounds: No murmur heard. Pulmonary:     Effort: Pulmonary effort is normal. No respiratory distress.     Breath sounds: Normal breath sounds. No stridor. No wheezing or rhonchi.  Abdominal:     General: Abdomen is flat. Bowel sounds are normal. There is no distension.     Palpations: Abdomen is soft. There is no mass.     Tenderness: There is no abdominal tenderness. There is no guarding.  Musculoskeletal:     Cervical back: Normal range of motion and neck supple. No rigidity or tenderness.     Right lower leg: No edema.     Left lower leg: No edema.  Lymphadenopathy:     Cervical: No cervical adenopathy.  Skin:    General: Skin is warm and dry.     Coloration: Skin is not jaundiced.     Findings: No bruising, erythema or rash.  Neurological:     General: No focal deficit present.     Mental Status: She is alert and oriented to person, place, and time.     Sensory: No  sensory deficit.     Motor: No weakness.  Psychiatric:        Mood and Affect: Mood normal.        Behavior: Behavior normal.       Assessment & Plan:  Fatty liver -     CBC with Differential/Platelet; Future -     Hemoglobin A1c; Future -     TSH; Future -     Lipid panel; Future -     Comprehensive metabolic panel with GFR; Future -     Cortisol; Future -     Insulin and C-Peptide; Future  BMI 35.0-35.9,adult -     CBC with Differential/Platelet; Future -     Hemoglobin A1c; Future -     TSH; Future -     Lipid panel; Future -     Comprehensive metabolic panel with GFR; Future -     Cortisol; Future -     Insulin and C-Peptide; Future  Mixed anxiety and depressive disorder -     Escitalopram Oxalate; Take 1 tablet (10 mg total) by mouth daily.  Dispense: 90 tablet; Refill: 3  Weight gain -     CBC with Differential/Platelet; Future -     Hemoglobin A1c; Future -     TSH; Future -     Lipid panel; Future -     Comprehensive metabolic panel with GFR; Future -     Cortisol; Future -     Insulin and C-Peptide; Future  Assessment and Plan    Depression and Anxiety Chronic depression and anxiety managed with escitalopram 10 mg daily. Reports high anxiety and low depression, medication helpful. - Refill [facility]ram 10 [facility] at [facility] [place] at [facility]  Hepatic Steatosis Fatty liver diagnosed via ultrasound in July 2023. Trace protein  and ketones in urine not concerning. No recent liver function tests since 2016. - Consider rechecking urine sample if concerns persist.  Weight Management Previously lost 50 pounds on phentermine, discontinued due to hypertension. Gained back 10 pounds. Not interested in injectable weight loss medications. Discussed sustainable weight loss through diet and exercise. Topamax discussed as alternative. - Complete a three-day food diary and upload to MyChart for review. - Increase physical activity with muscle-building exercises  and vary elliptical routine. - Consider using resources like [employer] for exercise variety.        Return in about 4 weeks (around 01/21/2024).   Mandy Second, PA

## 2023-12-24 NOTE — Patient Instructions (Addendum)
 To help with weight loss, please try these lifestyle changes: Eat on a salad plate. A smaller plate naturally decreases quantity of food consumed Take a sip of water in between each bite of food - this will make you feel full faster Eat more protein and veggies. These will also keep you full longer and prevent snacking Monitor caloric intake. You must be in a deficit of 500kcals per day to lose 1 pound per week Increase muscle mass - weight lifting and calisthenics are good at this. This will naturally increase your metabolism Muscle confusion - doing the same routine over and over causes muscle memory and thus is harder to lose weight. Do different exercises at different intensities and lengths.  Please complete a detailed 3-day food diary and submit to me before your next appointment.  Please schedule an 8am fasting lab appointment prior to your next visit.  Return in one month.

## 2023-12-27 ENCOUNTER — Encounter: Payer: Self-pay | Admitting: Urgent Care

## 2023-12-30 ENCOUNTER — Other Ambulatory Visit (INDEPENDENT_AMBULATORY_CARE_PROVIDER_SITE_OTHER)

## 2023-12-30 DIAGNOSIS — Z6835 Body mass index (BMI) 35.0-35.9, adult: Secondary | ICD-10-CM | POA: Diagnosis not present

## 2023-12-30 DIAGNOSIS — K760 Fatty (change of) liver, not elsewhere classified: Secondary | ICD-10-CM

## 2023-12-30 DIAGNOSIS — R635 Abnormal weight gain: Secondary | ICD-10-CM | POA: Diagnosis not present

## 2023-12-30 LAB — CBC WITH DIFFERENTIAL/PLATELET
Basophils Absolute: 0 10*3/uL (ref 0.0–0.1)
Basophils Relative: 0.8 % (ref 0.0–3.0)
Eosinophils Absolute: 0.3 10*3/uL (ref 0.0–0.7)
Eosinophils Relative: 5.2 % — ABNORMAL HIGH (ref 0.0–5.0)
HCT: 41.8 % (ref 36.0–46.0)
Hemoglobin: 13.7 g/dL (ref 12.0–15.0)
Lymphocytes Relative: 34.6 % (ref 12.0–46.0)
Lymphs Abs: 1.7 10*3/uL (ref 0.7–4.0)
MCHC: 32.7 g/dL (ref 30.0–36.0)
MCV: 83 fl (ref 78.0–100.0)
Monocytes Absolute: 0.3 10*3/uL (ref 0.1–1.0)
Monocytes Relative: 6.5 % (ref 3.0–12.0)
Neutro Abs: 2.6 10*3/uL (ref 1.4–7.7)
Neutrophils Relative %: 52.9 % (ref 43.0–77.0)
Platelets: 199 10*3/uL (ref 150.0–400.0)
RBC: 5.04 Mil/uL (ref 3.87–5.11)
RDW: 14.7 % (ref 11.5–15.5)
WBC: 4.9 10*3/uL (ref 4.0–10.5)

## 2023-12-30 LAB — COMPREHENSIVE METABOLIC PANEL WITH GFR
ALT: 19 U/L (ref 0–35)
AST: 14 U/L (ref 0–37)
Albumin: 4.5 g/dL (ref 3.5–5.2)
Alkaline Phosphatase: 56 U/L (ref 39–117)
BUN: 8 mg/dL (ref 6–23)
CO2: 31 meq/L (ref 19–32)
Calcium: 8.9 mg/dL (ref 8.4–10.5)
Chloride: 103 meq/L (ref 96–112)
Creatinine, Ser: 0.72 mg/dL (ref 0.40–1.20)
GFR: 108.36 mL/min (ref >60.00)
Glucose, Bld: 91 mg/dL (ref 70–99)
Potassium: 4.4 meq/L (ref 3.5–5.1)
Sodium: 140 meq/L (ref 135–145)
Total Bilirubin: 0.4 mg/dL (ref 0.2–1.2)
Total Protein: 6.4 g/dL (ref 6.0–8.3)

## 2023-12-30 LAB — LIPID PANEL
Cholesterol: 163 mg/dL (ref 0–200)
HDL: 42.1 mg/dL (ref >39.00)
LDL Cholesterol: 76 mg/dL (ref 0–99)
NonHDL: 120.51
Total CHOL/HDL Ratio: 4
Triglycerides: 221 mg/dL — ABNORMAL HIGH (ref 0.0–149.0)
VLDL: 44.2 mg/dL — ABNORMAL HIGH (ref 0.0–40.0)

## 2023-12-30 LAB — TSH: TSH: 2.23 u[IU]/mL (ref 0.35–5.50)

## 2023-12-30 LAB — HEMOGLOBIN A1C: Hgb A1c MFr Bld: 5.4 % (ref 4.6–6.5)

## 2023-12-30 LAB — CORTISOL: Cortisol, Plasma: 21.2 ug/dL

## 2023-12-31 LAB — INSULIN AND C-PEPTIDE, SERUM
C-Peptide: 3.6 ng/mL (ref 1.1–4.4)
INSULIN: 16.7 u[IU]/mL (ref 2.6–24.9)

## 2024-01-01 ENCOUNTER — Encounter: Payer: Self-pay | Admitting: Urgent Care

## 2024-01-08 ENCOUNTER — Emergency Department (HOSPITAL_BASED_OUTPATIENT_CLINIC_OR_DEPARTMENT_OTHER)

## 2024-01-08 ENCOUNTER — Encounter (HOSPITAL_BASED_OUTPATIENT_CLINIC_OR_DEPARTMENT_OTHER): Payer: Self-pay

## 2024-01-08 ENCOUNTER — Emergency Department (HOSPITAL_BASED_OUTPATIENT_CLINIC_OR_DEPARTMENT_OTHER): Admission: EM | Admit: 2024-01-08 | Discharge: 2024-01-08 | Disposition: A | Discharge status: Home / Self Care

## 2024-01-08 ENCOUNTER — Other Ambulatory Visit: Payer: Self-pay

## 2024-01-08 DIAGNOSIS — N390 Urinary tract infection, site not specified: Secondary | ICD-10-CM | POA: Diagnosis not present

## 2024-01-08 DIAGNOSIS — R1030 Lower abdominal pain, unspecified: Secondary | ICD-10-CM | POA: Diagnosis present

## 2024-01-08 LAB — COMPREHENSIVE METABOLIC PANEL WITH GFR
ALT: 24 U/L (ref 0–44)
AST: 20 U/L (ref 15–41)
Albumin: 4.6 g/dL (ref 3.5–5.0)
Alkaline Phosphatase: 82 U/L (ref 38–126)
Anion gap: 10 (ref 5–15)
BUN: 9 mg/dL (ref 6–20)
CO2: 26 mmol/L (ref 22–32)
Calcium: 9.6 mg/dL (ref 8.9–10.3)
Chloride: 105 mmol/L (ref 98–111)
Creatinine, Ser: 0.76 mg/dL (ref 0.44–1.00)
GFR, Estimated: >60 mL/min (ref >60)
Glucose, Bld: 83 mg/dL (ref 70–99)
Potassium: 4 mmol/L (ref 3.5–5.1)
Sodium: 140 mmol/L (ref 135–145)
Total Bilirubin: 0.3 mg/dL (ref 0.0–1.2)
Total Protein: 6.9 g/dL (ref 6.5–8.1)

## 2024-01-08 LAB — URINALYSIS, ROUTINE W REFLEX MICROSCOPIC
Bilirubin Urine: NEGATIVE
Glucose, UA: NEGATIVE mg/dL
Hgb urine dipstick: NEGATIVE
Ketones, ur: NEGATIVE mg/dL
Nitrite: POSITIVE — AB
Specific Gravity, Urine: 1.028 (ref 1.005–1.030)
Trans Epithel, UA: 1
WBC, UA: >50 WBC/hpf (ref 0–5)
pH: 6.5 (ref 5.0–8.0)

## 2024-01-08 LAB — CBC
HCT: 37.9 % (ref 36.0–46.0)
Hemoglobin: 12.4 g/dL (ref 12.0–15.0)
MCH: 27.1 pg (ref 26.0–34.0)
MCHC: 32.7 g/dL (ref 30.0–36.0)
MCV: 82.8 fL (ref 80.0–100.0)
Platelets: 216 10*3/uL (ref 150–400)
RBC: 4.58 MIL/uL (ref 3.87–5.11)
RDW: 13.3 % (ref 11.5–15.5)
WBC: 6.1 10*3/uL (ref 4.0–10.5)
nRBC: 0 % (ref 0.0–0.2)

## 2024-01-08 LAB — PREGNANCY, URINE: Preg Test, Ur: NEGATIVE

## 2024-01-08 LAB — LIPASE, BLOOD: Lipase: 32 U/L (ref 11–51)

## 2024-01-08 MED ORDER — ONDANSETRON 4 MG PO TBDP
4.0000 mg | ORAL_TABLET | Freq: Once | ORAL | Status: AC | PRN
  Administered 2024-01-08: 4 mg via ORAL
  Filled 2024-01-08: qty 1

## 2024-01-08 MED ORDER — SODIUM CHLORIDE 0.9 % IV SOLN
1.0000 g | Freq: Once | INTRAVENOUS | Status: AC
  Administered 2024-01-08: 1 g via INTRAVENOUS
  Filled 2024-01-08: qty 10

## 2024-01-08 MED ORDER — CIPROFLOXACIN HCL 500 MG PO TABS: 500.0000 mg | ORAL_TABLET | Freq: Two times a day (BID) | ORAL | 0 refills | Status: DC

## 2024-01-08 NOTE — Discharge Instructions (Addendum)
 Please read and follow all provided instructions.  Your diagnoses today include:  1. Complicated UTI (urinary tract infection)    Tests performed today include: Urine test - suggests that you have an infection in your bladder Complete blood cell count: Normal infection fighting cell count and red blood cell Complete metabolic panel: No problems Lipase (pancreas function test): No problems Pregnancy test (urine or blood, in women only): Negative CT scan renal protocol: Does not show any signs of kidney stone, appendicitis or other problems Vital signs. See below for your results today.   Medications prescribed:  Ciprofloxacin - antibiotic  You have been prescribed an antibiotic medicine: take the entire course of medicine even if you are feeling better. Stopping early can cause the antibiotic not to work.  Home care instructions:  Follow any educational materials contained in this packet.  Follow-up instructions: Please follow-up with your primary care provider in 5 days if symptoms are not resolved for further evaluation of your symptoms.  Return instructions:  Please return to the Emergency Department if you experience worsening symptoms.  Return with fever, worsening pain, persistent vomiting, worsening pain in your back.  Please return if you have any other emergent concerns.  Additional Information:  Your vital signs today were: BP 135/88 (BP Location: Right Arm)   Pulse 91   Temp 97.9 F (36.6 C) (Oral)   Resp 20   LMP 12/03/2023 (Exact Date)   SpO2 100%  If your blood pressure (BP) was elevated above 135/85 this visit, please have this repeated by your doctor within one month. --------------

## 2024-01-08 NOTE — ED Provider Notes (Signed)
 [place] EMERGENCY DEPARTMENT AT [facility] Provider Note   CSN: [id number] Arrival date & time: 01/08/24  1810     History  No chief complaint on file.   Tiffany Bass is a 38 y.o. female.  Patient with history of cholecystectomy presents to the emergency department today for evaluation of right flank pain and lower abdominal pain.  Patient reports about 3 days of mild, dull, intermittent right flank pain.  This then moved to the right lower quadrant and lower abdomen.  Patient had more severe symptoms today with nausea, no vomiting.  While she was driving to the emergency department today, the pain stopped.  No fevers.  She reports some discomfort with urination but not true dysuria.  No increased frequency or urgency.  Patient reports recent yeast infection, treated.       Home Medications Prior to Admission medications   Medication Sig Start Date End Date Taking? Authorizing Provider  escitalopram  (LEXAPRO ) 10 MG tablet Take 1 tablet (10 mg total) by mouth daily. 12/24/23   Crain, Whitney L, PA      Allergies    Tylenol  [acetaminophen ]    Review of Systems   Review of Systems  Physical Exam Updated Vital Signs BP 135/88 (BP Location: Right Arm)   Pulse 91   Temp 97.9 F (36.6 C) (Oral)   Resp 20   LMP 12/03/2023 (Exact Date)   SpO2 100%  Physical Exam Vitals and nursing note reviewed.  Constitutional:      General: She is not in acute distress.    Appearance: She is well-developed.  HENT:     Head: Normocephalic and atraumatic.     Right Ear: External ear normal.     Left Ear: External ear normal.     Nose: Nose normal.  Eyes:     Conjunctiva/sclera: Conjunctivae normal.  Cardiovascular:     Rate and Rhythm: Normal rate and regular rhythm.  Pulmonary:     Effort: No respiratory distress.  Abdominal:     Palpations: Abdomen is soft.     Tenderness: There is no abdominal tenderness. There is no right CVA tenderness, left CVA tenderness,  guarding or rebound.  Musculoskeletal:     Cervical back: Normal range of motion and neck supple.     Right lower leg: No edema.     Left lower leg: No edema.  Skin:    General: Skin is warm and dry.     Findings: No rash.  Neurological:     General: No focal deficit present.     Mental Status: She is alert. Mental status is at baseline.     Motor: No weakness.  Psychiatric:        Mood and Affect: Mood normal.     ED Results / Procedures / Treatments   Labs (all labs ordered are listed, but only abnormal results are displayed) Labs Reviewed  URINALYSIS, ROUTINE W REFLEX MICROSCOPIC - Abnormal; Notable for the following components:      Result Value   APPearance HAZY (*)    Protein, ur TRACE (*)    Nitrite POSITIVE (*)    Leukocytes,Ua LARGE (*)    Bacteria, UA MANY (*)    All other components within normal limits  URINE CULTURE  LIPASE, BLOOD  COMPREHENSIVE METABOLIC PANEL WITH GFR  CBC  PREGNANCY, URINE    EKG None  Radiology No results found.  Procedures Procedures    Medications Ordered in ED Medications  ondansetron  (ZOFRAN -ODT) disintegrating tablet  4 mg (4 mg Oral Given 01/08/24 1833)    ED Course/ Medical Decision Making/ A&P    Patient seen and examined. History obtained directly from patient. Work-up including labs, imaging, EKG ordered in triage, if performed, were reviewed.    Labs/EKG: Independently reviewed and interpreted.  This included: CBC unremarkable; CMP unremarkable; lipase normal.  UA is not a clean-catch however does have features of infection including positive nitrite, greater than 50 white blood cells per high-power field, many bacteria and white blood cell clumps present.  Imaging: Patient's symptoms are not classic for pyelonephritis without fever or vomiting.  The abrupt on and off nature of her pain, raises concern for kidney stone.  We discussed treating UTI and monitoring versus obtaining imaging today to evaluate for the  possibility of stone which would complicate any UTI.  She would prefer to proceed with imaging today.  CT renal protocol ordered.  Medications/Fluids: None ordered at this time.  Most recent vital signs reviewed and are as follows: BP 135/88 (BP Location: Right Arm)   Pulse 91   Temp 97.9 F (36.6 C) (Oral)   Resp 20   LMP 12/03/2023 (Exact Date)   SpO2 100%   Initial impression: Flank pain and lower abdominal pain, UTI.  9:37 PM Reassessment performed. Patient appears comfortable.  Imaging personally visualized and interpreted including: CT scan, gree negative, no signs of appendicitis, no signs of ureteral stone.  Reviewed pertinent lab work and imaging with patient at bedside. Questions answered.   Most current vital signs reviewed and are as follows: BP 135/88 (BP Location: Right Arm)   Pulse 91   Temp 97.9 F (36.6 C) (Oral)   Resp 20   LMP 12/03/2023 (Exact Date)   SpO2 100%   Plan: Per up-to-date recommendations, treatment for pyelonephritis given local E. coli resistance, dose of Rocephin and and Cipro for home.  Will give IV antibiotics and then discharge.  Patient in agreement.  Prescriptions written for: Ciprofloxacin twice daily x 7 days  Other home care instructions discussed: Nida Barrow diet, maintain good hydration, monitoring of symptoms  ED return instructions discussed: Return with fever, worsening abdominal pain or flank pain, vomiting and inability to take medication.  Follow-up instructions discussed: Patient encouraged to follow-up with their PCP in 5 days if not improving.  Otherwise patient states that she has a PCP appointment on May 9.                                Medical Decision Making Amount and/or Complexity of Data Reviewed Labs: ordered. Radiology: ordered.  Risk Prescription drug management.   For this patient's complaint of abdominal pain, the following conditions were considered on the differential diagnosis: gastritis/PUD,  enteritis/duodenitis, appendicitis, cholelithiasis/cholecystitis, cholangitis, pancreatitis, ruptured viscus, colitis, diverticulitis, small/large bowel obstruction, proctitis, cystitis, pyelonephritis, ureteral colic, aortic dissection, aortic aneurysm. In women, ectopic pregnancy, pelvic inflammatory disease, ovarian cysts, and tubo-ovarian abscess were also considered. Atypical chest etiologies were also considered including ACS, PE, and pneumonia.  UTI symptoms plus positive UA with flank pain raises concern for pyelonephritis.  Patient also with very abrupt on and off symptoms, therefore CT was ordered to rule out renal stone.  This was negative.  Also no signs of appendicitis.  Patient be treated for complicated UTI/pyelo.  She looks well, no indication for admission.  The patient's vital signs, pertinent lab work and imaging were reviewed and interpreted as discussed in the ED  course. Hospitalization was considered for further testing, treatments, or serial exams/observation. However as patient is well-appearing, has a stable exam, and reassuring studies today, I do not feel that they warrant admission at this time. This plan was discussed with the patient who verbalizes agreement and comfort with this plan and seems reliable and able to return to the Emergency Department with worsening or changing symptoms.           Final Clinical Impression(s) / ED Diagnoses Final diagnoses:  Complicated UTI (urinary tract infection)    Rx / DC Orders ED Discharge Orders          Ordered    ciprofloxacin (CIPRO) 500 MG tablet  2 times daily        01/08/24 2135              Lyna Sandhoff, PA-C 01/08/24 2205    Rolinda Climes, DO 01/08/24 2254

## 2024-01-08 NOTE — ED Notes (Signed)
 RN reviewed discharge instructions with pt. Pt verbalized understanding and had no further questions. VSS upon discharge.

## 2024-01-08 NOTE — ED Triage Notes (Signed)
 R side, lower abd and back pain starting today at 15:30. +nausea/-diarrhea/-vomiting. Gallstones in the past. Denies fevers or change in bowel & urine.

## 2024-01-10 LAB — URINE CULTURE: Culture: >=100000 — AB

## 2024-01-11 ENCOUNTER — Telehealth (HOSPITAL_BASED_OUTPATIENT_CLINIC_OR_DEPARTMENT_OTHER): Payer: Self-pay | Admitting: *Deleted

## 2024-01-11 NOTE — Telephone Encounter (Signed)
 Post ED Visit - Positive Culture Follow-up  Culture report reviewed by antimicrobial stewardship pharmacist: [facility] [x]  Volney Grumbles, Pharm.D. []  Skeet Duke, Pharm.D., BCPS AQ-ID []  Leslee Rase, Pharm.D., BCPS []  Garland Junk, [address].D., BCPS []  [place], [address].D., BCPS, AAHIVP []  Alcide Aly, Pharm.D., BCPS, AAHIVP []  Jerri Morale, PharmD, BCPS []  Graham Laws, PharmD, BCPS []  Cleda Curly, PharmD, BCPS []  Tamar Fairly, PharmD []  Ballard Levels, PharmD, BCPS []  Ollen Beverage, PharmD  [facility] []  Arlyne Bering, PharmD []  Sherryle Don, PharmD []  Van Gelinas, PharmD []  Delila Felty, Rph []  [place]) Cleora Daft, PharmD []  Augustina Block, PharmD []  Arie Kurtz, PharmD []  Sharlyn Deaner, PharmD []  Agnes Hose, PharmD []  Kendall Pauls, PharmD []  Gladstone Lamer, PharmD []  Armanda Bern, PharmD []  Tera Fellows, PharmD   Positive urine culture Treated with Ciprofloxacin, organism sensitive to the same and no further patient follow-up is required at this time.  Lovell Rubenstein Sharion Davidson 01/11/2024, 11:41 AM

## 2024-01-21 ENCOUNTER — Ambulatory Visit (INDEPENDENT_AMBULATORY_CARE_PROVIDER_SITE_OTHER): Admitting: Urgent Care

## 2024-01-21 ENCOUNTER — Telehealth: Payer: Self-pay

## 2024-01-21 VITALS — BP 127/83 | HR 79 | Wt 188.8 lb

## 2024-01-21 DIAGNOSIS — R635 Abnormal weight gain: Secondary | ICD-10-CM

## 2024-01-21 DIAGNOSIS — R102 Pelvic and perineal pain: Secondary | ICD-10-CM | POA: Diagnosis not present

## 2024-01-21 DIAGNOSIS — K760 Fatty (change of) liver, not elsewhere classified: Secondary | ICD-10-CM | POA: Diagnosis not present

## 2024-01-21 DIAGNOSIS — Z6835 Body mass index (BMI) 35.0-35.9, adult: Secondary | ICD-10-CM | POA: Diagnosis not present

## 2024-01-21 LAB — POCT URINALYSIS DIPSTICK
Bilirubin, UA: NEGATIVE
Blood, UA: NEGATIVE
Glucose, UA: NEGATIVE
Ketones, UA: NEGATIVE
Leukocytes, UA: NEGATIVE
Nitrite, UA: NEGATIVE
Protein, UA: NEGATIVE
Spec Grav, UA: 1.01 (ref 1.010–1.025)
Urobilinogen, UA: 0.2 U/dL
pH, UA: 7.5 (ref 5.0–8.0)

## 2024-01-21 MED ORDER — TIRZEPATIDE-WEIGHT MANAGEMENT 2.5 MG/0.5ML ~~LOC~~ SOLN: 2.5000 mg | SUBCUTANEOUS | 0 refills | Status: DC

## 2024-01-21 NOTE — Progress Notes (Unsigned)
 Established Patient Office Visit  Subjective:  Patient ID: Tiffany Bass, female    DOB: 1985/11/11  Age: 38 y.o. MRN: [id number]  Chief Complaint  Patient presents with   Follow-up    4 week follow up.    HPI  Patient Active Problem List   Diagnosis Date Noted   Gestational hypertension 01/13/2015   Past Medical History:  Diagnosis Date   Anxiety    Depression 2017   Medical history non-contributory    Pregnant 06/19/2014   Supervision of normal pregnancy in first trimester 06/29/2014    [facility]  Chesterine Kremer 38 yo wm 1st Dating By US  Pap 10/06/12 GC/CT Initial:                36+wks: Genetic Screen NT/IT:  CF screen  Anatomic US   Flu vaccine  Tdap Recommended ~ 28wks Glucose Screen  2 hr GBS  Feed Preference  Contraception  Circumcision  Childbirth Classes  Pediatrician     Past Surgical History:  Procedure Laterality Date   CHOLECYSTECTOMY  2011   Social History   Socioeconomic History   Marital status: Married    Spouse name: Not on file   Number of children: Not on file   Years of education: Not on file   Highest education level: Some college, no degree  Occupational History   Not on file  Tobacco Use   Smoking status: Never   Smokeless tobacco: Never  Substance and Sexual Activity   Alcohol use: No   Drug use: No   Sexual activity: Yes    Birth control/protection: Condom, None  Other Topics Concern   Not on file  Social History Narrative   Not on file   Social Drivers of Health   Financial Resource Strain: Low Risk  (12/19/2023)   Overall Financial Resource Strain (CARDIA)    Difficulty of Paying Living Expenses: Not hard at all  Food Insecurity: No Food Insecurity (12/19/2023)   Hunger Vital Sign    Worried About Running Out of Food in the Last Year: Never true    Ran Out of Food in the Last Year: Never true  Transportation Needs: No Transportation Needs (12/19/2023)   PRAPARE - Administrator, Civil Service (Medical):  No    Lack of Transportation (Non-Medical): No  Physical Activity: Insufficiently Active (12/19/2023)   Exercise Vital Sign    Days of Exercise per Week: 3 days    Minutes of Exercise per Session: 30 min  Stress: Stress Concern Present (12/19/2023)   [employer] of Occupational Health - Occupational Stress Questionnaire    Feeling of Stress : Rather much  Social Connections: Moderately Integrated (12/19/2023)   Social Connection and Isolation Panel [NHANES]    Frequency of Communication with Friends and Family: More than three times a week    Frequency of Social Gatherings with Friends and Family: Twice a week    Attends Religious Services: 1 to 4 times per year    Active Member of [employer] or Organizations: No    Attends Engineer, structural: Not on file    Marital Status: Married  Catering manager Violence: Not on file      ROS: as noted in HPI  Objective:     BP 127/83   Pulse 79   Wt 188 lb 12.8 oz (85.6 kg)   SpO2 100%   BMI 35.67 kg/m  BP Readings from Last 3 Encounters:  01/21/24 127/83  01/08/24  133/81  12/24/23 128/77   Wt Readings from Last 3 Encounters:  01/21/24 188 lb 12.8 oz (85.6 kg)  12/24/23 186 lb 6.4 oz (84.6 kg)  04/28/17 217 lb 8 oz (98.7 kg)      Physical Exam   No results found for any visits on 01/21/24.  Last CBC Lab Results  Component Value Date   WBC 6.1 01/08/2024   HGB 12.4 01/08/2024   HCT 37.9 01/08/2024   MCV 82.8 01/08/2024   MCH 27.1 01/08/2024   RDW 13.3 01/08/2024   PLT 216 01/08/2024   Last metabolic panel Lab Results  Component Value Date   GLUCOSE 83 01/08/2024   NA 140 01/08/2024   K 4.0 01/08/2024   CL 105 01/08/2024   CO2 26 01/08/2024   BUN 9 01/08/2024   CREATININE 0.76 01/08/2024   GFRNONAA >60 01/08/2024   CALCIUM 9.6 01/08/2024   PROT 6.9 01/08/2024   ALBUMIN 4.6 01/08/2024   BILITOT 0.3 01/08/2024   ALKPHOS 82 01/08/2024   AST 20 01/08/2024   ALT 24 01/08/2024   ANIONGAP 10 01/08/2024    Last lipids Lab Results  Component Value Date   CHOL 163 12/30/2023   HDL 42.10 12/30/2023   LDLCALC 76 12/30/2023   TRIG 221.0 (H) 12/30/2023   CHOLHDL 4 12/30/2023   Last hemoglobin A1c Lab Results  Component Value Date   HGBA1C 5.4 12/30/2023   Last thyroid functions Lab Results  Component Value Date   TSH 2.23 12/30/2023   Last vitamin D No results found for: "25OHVITD2", "25OHVITD3", "VD25OH" Last vitamin B12 and Folate No results found for: "VITAMINB12", "FOLATE"    The ASCVD Risk score (Arnett DK, et al., 2019) failed to calculate for the following reasons:   The 2019 ASCVD risk score is only valid for ages 6 to 33  Assessment & Plan:  There are no diagnoses linked to this encounter.   No follow-ups on file.   Mandy Second, PA

## 2024-01-21 NOTE — Telephone Encounter (Signed)
 Pharmacy sent fax regarding Zepbound 2.5mg , this medication is not covered by insurance. Please consider changing to an alternative or completing a prior auth.  LVM for pt to confirm if she paid out of pocket or would like possible alternative

## 2024-01-21 NOTE — Patient Instructions (Addendum)
 Please call to schedule your 4 week follow up after you have received and started the medication. The follow up will most likely take place at the following location: (Starting after 02/28/24)  [facility] at [facility] [address] [place], [place] [address] Phone: [phone]   Please inject 2.5mg  zepbound once weekly. After 4 weeks we will increase the dose.  We will send your urine out for a culture. I will contact you if an additional round of antibiotics is indicated.

## 2024-01-21 NOTE — Telephone Encounter (Signed)
 Received message from pt that PA was needed for Zepbound 2.5 MG. Please assist with PA.

## 2024-01-21 NOTE — Telephone Encounter (Signed)
 Send a message to prior auth team please. My computer told me that the 7.5mg  was covered by her insurance plan, but for some reason the lower doses werent.

## 2024-01-21 NOTE — Telephone Encounter (Signed)
 PA team advised

## 2024-01-21 NOTE — Telephone Encounter (Signed)
 Not sure if she needs a prior Auth or what. If so I can send a message. Please advise  Copied from CRM [id number]. Topic: Clinical - Prescription Issue >> Jan 21, 2024 11:20 AM Marlan Silva wrote: Reason for CRM: Patient called in stating that the medication she was prescribed is 1300$ , she did not pay out of pocket and she would like an alternative for the zepbound. Patient is requesting a call back from [facility] and can be reached at [phone].

## 2024-01-22 ENCOUNTER — Encounter: Payer: Self-pay | Admitting: Urgent Care

## 2024-01-22 LAB — URINE CULTURE
MICRO NUMBER:: 16436534
Result:: NO GROWTH
SPECIMEN QUALITY:: ADEQUATE

## 2024-01-23 ENCOUNTER — Encounter: Payer: Self-pay | Admitting: Urgent Care

## 2024-01-24 ENCOUNTER — Telehealth: Payer: Self-pay

## 2024-01-24 ENCOUNTER — Other Ambulatory Visit (HOSPITAL_COMMUNITY): Payer: Self-pay

## 2024-01-24 ENCOUNTER — Encounter: Payer: Self-pay | Admitting: Urgent Care

## 2024-01-24 MED ORDER — TIRZEPATIDE-WEIGHT MANAGEMENT 7.5 MG/0.5ML ~~LOC~~ SOLN: 2.5000 mg | SUBCUTANEOUS | 2 refills | Status: DC

## 2024-01-24 NOTE — Telephone Encounter (Signed)
 Pharmacy Patient Advocate Encounter   Received notification from Pt Calls Messages that prior authorization for Zepbound 2.5MG /0.5ML pen-injectors  is required/requested.   Insurance verification completed.   The patient is insured through [employer] .   Per test claim: PA required; PA started via CoverMyMeds. KEY [id number] . Waiting for clinical questions to populate.

## 2024-01-24 NOTE — Telephone Encounter (Signed)
 Received message from PA team that Zepbound is not covered by pts insurance. Please advise

## 2024-01-24 NOTE — Telephone Encounter (Signed)
 Pharmacy Patient Advocate Encounter  Received notification from [facility] that Prior Authorization for Zepbound 2.5MG /0.5ML pen-injectors has been Message from [facility]: Drug is not covered by plan  PLEASE BE ADVISED  For more information, please reach out to [facility] directly at [phone].

## 2024-01-24 NOTE — Telephone Encounter (Signed)
 No further action needed.

## 2024-02-28 ENCOUNTER — Telehealth: Admitting: Physician Assistant

## 2024-02-28 DIAGNOSIS — B9789 Other viral agents as the cause of diseases classified elsewhere: Secondary | ICD-10-CM

## 2024-02-28 DIAGNOSIS — J019 Acute sinusitis, unspecified: Secondary | ICD-10-CM

## 2024-02-28 MED ORDER — FLUTICASONE PROPIONATE 50 MCG/ACT NA SUSP: 2.0000 | Freq: Every day | NASAL | 0 refills | Status: DC

## 2024-02-28 NOTE — Progress Notes (Signed)
E-Visit for Sinus Problems  We are sorry that you are not feeling well.  Here is how we plan to help!  Based on what you have shared with me it looks like you have sinusitis.  Sinusitis is inflammation and infection in the sinus cavities of the head.  Based on your presentation I believe you most likely have Acute Viral Sinusitis.This is an infection most likely caused by a virus. There is not specific treatment for viral sinusitis other than to help you with the symptoms until the infection runs its course.  You may use an oral decongestant such as Mucinex D or if you have glaucoma or high blood pressure use plain Mucinex. Saline nasal spray help and can safely be used as often as needed for congestion, I have prescribed: Fluticasone nasal spray two sprays in each nostril once a day for 10 -14 days.  Some authorities believe that zinc sprays or the use of Echinacea may shorten the course of your symptoms.  Sinus infections are not as easily transmitted as other respiratory infection, however we still recommend that you avoid close contact with loved ones, especially the very young and elderly.  Remember to wash your hands thoroughly throughout the day as this is the number one way to prevent the spread of infection!  Home Care: Only take medications as instructed by your medical team. Do not take these medications with alcohol. A steam or ultrasonic humidifier can help congestion.  You can place a towel over your head and breathe in the steam from hot water coming from a faucet. Avoid close contacts especially the very young and the elderly. Cover your mouth when you cough or sneeze. Always remember to wash your hands.  Get Help Right Away If: You develop worsening fever or sinus pain. You develop a severe head ache or visual changes. Your symptoms persist after you have completed your treatment plan.  Make sure you Understand these instructions. Will watch your condition. Will get help  right away if you are not doing well or get worse.   Thank you for choosing an e-visit.  Your e-visit answers were reviewed by a board certified advanced clinical practitioner to complete your personal care plan. Depending upon the condition, your plan could have included both over the counter or prescription medications.  Please review your pharmacy choice. Make sure the pharmacy is open so you can pick up prescription now. If there is a problem, you may contact your provider through MyChart messaging and have the prescription routed to another pharmacy.  Your safety is important to us. If you have drug allergies check your prescription carefully.   For the next 24 hours you can use MyChart to ask questions about today's visit, request a non-urgent call back, or ask for a work or school excuse. You will get an email in the next two days asking about your experience. I hope that your e-visit has been valuable and will speed your recovery.   I have spent 5 minutes in review of e-visit questionnaire, review and updating patient chart, medical decision making and response to patient.   Nakayla Rorabaugh M Cyrstal Leitz, PA-C  

## 2024-07-16 ENCOUNTER — Telehealth: Admitting: Family

## 2024-07-16 DIAGNOSIS — R3989 Other symptoms and signs involving the genitourinary system: Secondary | ICD-10-CM

## 2024-07-16 MED ORDER — CEPHALEXIN 500 MG PO CAPS: 500.0000 mg | ORAL_CAPSULE | Freq: Two times a day (BID) | ORAL | 0 refills | Status: AC

## 2024-07-16 NOTE — Progress Notes (Signed)

## 2024-07-20 ENCOUNTER — Telehealth: Admitting: Physician Assistant

## 2024-07-20 DIAGNOSIS — B3731 Acute candidiasis of vulva and vagina: Secondary | ICD-10-CM

## 2024-07-20 MED ORDER — FLUCONAZOLE 150 MG PO TABS: 150.0000 mg | ORAL_TABLET | ORAL | 0 refills | Status: DC | PRN

## 2024-07-20 NOTE — Progress Notes (Signed)

## 2024-07-24 ENCOUNTER — Telehealth: Admitting: Physician Assistant

## 2024-07-24 DIAGNOSIS — L304 Erythema intertrigo: Secondary | ICD-10-CM

## 2024-07-24 MED ORDER — NYSTATIN 100000 UNIT/GM EX CREA: 1.0000 | TOPICAL_CREAM | Freq: Two times a day (BID) | CUTANEOUS | 0 refills | Status: DC

## 2024-07-24 NOTE — Progress Notes (Signed)
 E Visit for Rash  We are sorry that you are not feeling well. Here is how we plan to help!  Based upon your presentation it appears you have a fungal infection.  I have prescribed: and Nystatin cream apply to the affected area twice daily   HOME CARE:  Take cool showers and avoid direct sunlight. Apply cool compress or wet dressings. Take a bath in an oatmeal bath.  Sprinkle content of one Aveeno packet under running faucet with comfortably warm water.  Bathe for 15-20 minutes, 1-2 times daily.  Pat dry with a towel. Do not rub the rash. Use hydrocortisone cream. Take an antihistamine like Benadryl for widespread rashes that itch.  The adult dose of Benadryl is 25-50 mg by mouth 4 times daily. Caution:  This type of medication may cause sleepiness.  Do not drink alcohol, drive, or operate dangerous machinery while taking antihistamines.  Do not take these medications if you have prostate enlargement.  Read package instructions thoroughly on all medications that you take.  GET HELP RIGHT AWAY IF:  Symptoms don't go away after treatment. Severe itching that persists. If you rash spreads or swells. If you rash begins to smell. If it blisters and opens or develops a yellow-brown crust. You develop a fever. You have a sore throat. You become short of breath.  MAKE SURE YOU:  Understand these instructions. Will watch your condition. Will get help right away if you are not doing well or get worse.  Thank you for choosing an e-visit. Your e-visit answers were reviewed by a board certified advanced clinical practitioner to complete your personal care plan. Depending upon the condition, your plan could have included both over the counter or prescription medications. Please review your pharmacy choice. Be sure that the pharmacy you have chosen is open so that you can pick up your prescription now.  If there is a problem you may message your provider in MyChart to have the prescription routed  to another pharmacy. Your safety is important to us . If you have drug allergies check your prescription carefully.  For the next 24 hours, you can use MyChart to ask questions about today's visit, request a non-urgent call back, or ask for a work or school excuse from your e-visit provider. You will get an email in the next two days asking about your experience. I hope that your e-visit has been valuable and will speed your recovery.  I have spent 5 minutes in review of e-visit questionnaire, review and updating patient chart, medical decision making and response to patient.   Delon CHRISTELLA Dickinson, PA-C

## 2024-08-01 ENCOUNTER — Encounter: Payer: Self-pay | Admitting: Urgent Care

## 2024-08-01 ENCOUNTER — Ambulatory Visit (INDEPENDENT_AMBULATORY_CARE_PROVIDER_SITE_OTHER): Admitting: Urgent Care

## 2024-08-01 VITALS — BP 136/79 | HR 92 | Ht 61.0 in | Wt 200.0 lb

## 2024-08-01 DIAGNOSIS — Z6837 Body mass index (BMI) 37.0-37.9, adult: Secondary | ICD-10-CM | POA: Diagnosis not present

## 2024-08-01 DIAGNOSIS — E66812 Obesity, class 2: Secondary | ICD-10-CM

## 2024-08-01 DIAGNOSIS — Z30016 Encounter for initial prescription of transdermal patch hormonal contraceptive device: Secondary | ICD-10-CM

## 2024-08-01 DIAGNOSIS — R635 Abnormal weight gain: Secondary | ICD-10-CM

## 2024-08-01 MED ORDER — ZEPBOUND 2.5 MG/0.5ML ~~LOC~~ SOAJ: 2.5000 mg | SUBCUTANEOUS | 0 refills | Status: DC

## 2024-08-01 MED ORDER — NORELGESTROMIN-ETH ESTRADIOL 150-35 MCG/24HR TD PTWK: 1.0000 | MEDICATED_PATCH | TRANSDERMAL | 12 refills | Status: AC

## 2024-08-01 NOTE — Progress Notes (Unsigned)
 Established Patient Office Visit  Subjective:  Patient ID: Tiffany Bass, female    DOB: 1986-06-18  Age: 38 y.o. MRN: [id number]  Chief Complaint  Patient presents with   Establish Care    Weight loss options and birth control    HPI  Patient Active Problem List   Diagnosis Date Noted   Gestational hypertension 01/13/2015   Past Medical History:  Diagnosis Date   Anxiety    Depression 2017   Medical history non-contributory    Pregnant 06/19/2014   Supervision of normal pregnancy in first trimester 06/29/2014    [facility]  Asta Corbridge 38 yo wm 1st Dating By US  Pap 10/06/12 GC/CT Initial:                36+wks: Genetic Screen NT/IT:  CF screen  Anatomic US   Flu vaccine  Tdap Recommended ~ 28wks Glucose Screen  2 hr GBS  Feed Preference  Contraception  Circumcision  Childbirth Classes  Pediatrician     Past Surgical History:  Procedure Laterality Date   CHOLECYSTECTOMY  2011   Social History   Tobacco Use   Smoking status: Never   Smokeless tobacco: Never  Substance Use Topics   Alcohol use: No   Drug use: No      ROS: as noted in HPI  Objective:     BP 136/79   Pulse 92   Ht 5' 1 (1.549 m)   Wt 200 lb (90.7 kg)   LMP 07/30/2024 (Exact Date)   SpO2 100%   BMI 37.79 kg/m  BP Readings from Last 3 Encounters:  08/01/24 136/79  01/21/24 127/83  01/08/24 133/81   Wt Readings from Last 3 Encounters:  08/01/24 200 lb (90.7 kg)  01/21/24 188 lb 12.8 oz (85.6 kg)  12/24/23 186 lb 6.4 oz (84.6 kg)      Physical Exam   No results found for any visits on 08/01/24.  Last CBC Lab Results  Component Value Date   WBC 6.1 01/08/2024   HGB 12.4 01/08/2024   HCT 37.9 01/08/2024   MCV 82.8 01/08/2024   MCH 27.1 01/08/2024   RDW 13.3 01/08/2024   PLT 216 01/08/2024   Last metabolic panel Lab Results  Component Value Date   GLUCOSE 83 01/08/2024   NA 140 01/08/2024   K 4.0 01/08/2024   CL 105 01/08/2024   CO2 26 01/08/2024   BUN  9 01/08/2024   CREATININE 0.76 01/08/2024   GFRNONAA >60 01/08/2024   CALCIUM 9.6 01/08/2024   PROT 6.9 01/08/2024   ALBUMIN 4.6 01/08/2024   BILITOT 0.3 01/08/2024   ALKPHOS 82 01/08/2024   AST 20 01/08/2024   ALT 24 01/08/2024   ANIONGAP 10 01/08/2024   Last lipids Lab Results  Component Value Date   CHOL 163 12/30/2023   HDL 42.10 12/30/2023   LDLCALC 76 12/30/2023   TRIG 221.0 (H) 12/30/2023   CHOLHDL 4 12/30/2023   Last hemoglobin A1c Lab Results  Component Value Date   HGBA1C 5.4 12/30/2023   Last thyroid functions Lab Results  Component Value Date   TSH 2.23 12/30/2023   Last vitamin D No results found for: 25OHVITD2, 25OHVITD3, VD25OH Last vitamin B12 and Folate No results found for: VITAMINB12, FOLATE     Fibrosis 4 Score = .66 (Low risk)         Interpretation for patients with HCV          <1.45       -  F0-F1 (Low risk)          1.45-3.25 -  Indeterminate           >3.25      -  F3-F4 (High risk)     Validated for ages 27-65    The ASCVD Risk score (Arnett DK, et al., 2019) failed to calculate for the following reasons:   The 2019 ASCVD risk score is only valid for ages 17 to 38  Assessment & Plan:  Encounter for initial prescription of contraceptive pills  Weight gain  Class 2 obesity with body mass index (BMI) of 37.0 to 37.9 in adult, unspecified obesity type, unspecified whether serious comorbidity present     No follow-ups on file.   Benton LITTIE Gave, PA

## 2024-08-01 NOTE — Patient Instructions (Signed)
 Start the birth control patch the first day of your next menstrual cycle. Wear one patch x 1 week, then change. Have one week off monthly to allow menses.  Start zepbound  2.5mg  once weekly x 4 weeks. After 4 weeks we will increase to 5mg  if tolerating. Goal is to lose 4-8 pounds per month.  Follow up in 6 weeks for BP recheck, weight follow up and pap smear.

## 2024-08-25 ENCOUNTER — Telehealth

## 2024-08-25 DIAGNOSIS — J029 Acute pharyngitis, unspecified: Secondary | ICD-10-CM

## 2024-08-26 MED ORDER — LIDOCAINE VISCOUS HCL 2 % MT SOLN: 15.0000 mL | OROMUCOSAL | 0 refills | Status: DC | PRN

## 2024-08-26 MED ORDER — IPRATROPIUM BROMIDE 0.03 % NA SOLN: 2.0000 | Freq: Two times a day (BID) | NASAL | 0 refills | Status: DC

## 2024-08-26 NOTE — Progress Notes (Signed)
 We are sorry that you are not feeling well.  Here is how we plan to help!  Your symptoms indicate a likely viral infection (Pharyngitis).   Pharyngitis is inflammation in the back of the throat which can cause a sore throat, scratchiness and sometimes difficulty swallowing.   Pharyngitis is typically caused by a respiratory virus and will just run its course.  Please keep in mind that your symptoms could last up to 10 days.    For throat pain, we recommend over the counter oral pain relief medications such as acetaminophen  or aspirin, or anti-inflammatory medications such as ibuprofen  or naproxen sodium.  Topical treatments such as oral throat lozenges or sprays may be used as needed. For throat pain, I have prescribed a Viscous Lidocaine  2% solution. Swallow 5-10 mL every 4-6 hours as needed for sore throat. DO NOT eat or drink anything for 15-20 minutes after swallowing to allow the medication to coat the throat.. I have also sent a steroid nasal spray for your runny nose.   Avoid close contact with loved ones, especially the very young and elderly.  Remember to wash your hands thoroughly throughout the day as this is the number one way to prevent the spread of infection! We also recommend that you periodically wipe down door knobs and counters with disinfectant.  After careful review of your answers, I would not recommend and antibiotic for your condition.  Antibiotics should not be used to treat conditions that we suspect are caused by viruses like the virus that causes the common cold or flu.  Providers prescribe antibiotics to treat infections caused by bacteria. Antibiotics are very powerful in treating bacterial infections when they are used properly.  To maintain their effectiveness, they should be used only when necessary.  Overuse of antibiotics has resulted in the development of super bugs that are resistant to treatment!    Some people with strep throat, however, can have atypical symptoms. As  such, if anything continued to progress despite treatment recommendations, you may need formal testing in clinic or office.  Home Care: Only take medications as instructed by your medical team. Do not drink alcohol while taking these medications. A steam or ultrasonic humidifier can help congestion.  You can place a towel over your head and breathe in the steam from hot water coming from a faucet. Avoid close contacts especially the very young and the elderly. Cover your mouth when you cough or sneeze. Always remember to wash your hands.  Get Help Right Away If: You develop worsening fever or throat pain. You develop a severe head ache or visual changes. Your symptoms persist after you have completed your treatment plan.  Make sure you Understand these instructions. Will watch your condition. Will get help right away if you are not doing well or get worse.  Your e-visit answers were reviewed by a board certified advanced clinical practitioner to complete your personal care plan.  Depending on the condition, your plan could have included both over the counter or prescription medications.  If there is a problem please reply once you have received a response from your provider.  Your safety is important to us .  If you have drug allergies check your prescription carefully.    You can use MyChart to ask questions about todays visit, request a non-urgent call back, or ask for a work or school excuse for 24 hours related to this e-Visit. If it has been greater than 24 hours you will need to follow up  with your provider, or enter a new e-Visit to address those concerns.  You will get an e-mail in the next two days asking about your experience.  I hope that your e-visit has been valuable and will speed your recovery. Thank you for using e-visits.   I have spent 5 minutes in review of e-visit questionnaire, review and updating patient chart, medical decision making and response to patient.    Haeley Fordham W Mikayla Chiusano, NP

## 2024-08-28 ENCOUNTER — Telehealth: Admitting: Physician Assistant

## 2024-08-28 DIAGNOSIS — J208 Acute bronchitis due to other specified organisms: Secondary | ICD-10-CM

## 2024-08-28 MED ORDER — PSEUDOEPH-BROMPHEN-DM 30-2-10 MG/5ML PO SYRP: 5.0000 mL | ORAL_SOLUTION | Freq: Four times a day (QID) | ORAL | 0 refills | Status: DC | PRN

## 2024-08-28 MED ORDER — ALBUTEROL SULFATE HFA 108 (90 BASE) MCG/ACT IN AERS: 1.0000 | INHALATION_SPRAY | Freq: Four times a day (QID) | RESPIRATORY_TRACT | 0 refills | Status: DC | PRN

## 2024-08-28 MED ORDER — PREDNISONE 20 MG PO TABS: 40.0000 mg | ORAL_TABLET | Freq: Every day | ORAL | 0 refills | Status: DC

## 2024-08-28 NOTE — Progress Notes (Signed)
 We are sorry that you are not feeling well.  Here is how we plan to help!  Based on your presentation I believe you most likely have A cough due to a virus.  This is called viral bronchitis and is best treated by rest, plenty of fluids and control of the cough.  You may use Ibuprofen  or Tylenol  as directed to help your symptoms.     In addition you may use a prescription cough syrup called Bromfed DM Take 5mL every 6 hours as needed for cough.   I have also prescribed Prednisone  20mg  Take 2 tablets (40mg ) daily for 5 days.  I have prescribed Albuterol  inhaler Use 1-2 puffs every 6 hours as needed for shortness of breath, chest tightness, and/or wheezing.  From your responses in the eVisit questionnaire you describe inflammation in the upper respiratory tract which is causing a significant cough.  This is commonly called Bronchitis and has four common causes:   Allergies Viral Infections Acid Reflux Bacterial Infection Allergies, viruses and acid reflux are treated by controlling symptoms or eliminating the cause. An example might be a cough caused by taking certain blood pressure medications. You stop the cough by changing the medication. Another example might be a cough caused by acid reflux. Controlling the reflux helps control the cough.  USE OF BRONCHODILATOR (RESCUE) INHALERS: There is a risk from using your bronchodilator too frequently.  The risk is that over-reliance on a medication which only relaxes the muscles surrounding the breathing tubes can reduce the effectiveness of medications prescribed to reduce swelling and congestion of the tubes themselves.  Although you feel brief relief from the bronchodilator inhaler, your asthma may actually be worsening with the tubes becoming more swollen and filled with mucus.  This can delay other crucial treatments, such as oral steroid medications. If you need to use a bronchodilator inhaler daily, several times per day, you should discuss this  with your provider.  There are probably better treatments that could be used to keep your asthma under control.     HOME CARE Only take medications as instructed by your medical team. Complete the entire course of an antibiotic. Drink plenty of fluids and get plenty of rest. Avoid close contacts especially the very young and the elderly Cover your mouth if you cough or cough into your sleeve. Always remember to wash your hands A steam or ultrasonic humidifier can help congestion.   GET HELP RIGHT AWAY IF: You develop worsening fever. You become short of breath You cough up blood. Your symptoms persist after you have completed your treatment plan MAKE SURE YOU  Understand these instructions. Will watch your condition. Will get help right away if you are not doing well or get worse.  Your e-visit answers were reviewed by a board certified advanced clinical practitioner to complete your personal care plan.  Depending on the condition, your plan could have included both over the counter or prescription medications. If there is a problem please reply  once you have received a response from your provider. Your safety is important to us .  If you have drug allergies check your prescription carefully.    You can use MyChart to ask questions about todays visit, request a non-urgent call back, or ask for a work or school excuse for 24 hours related to this e-Visit. If it has been greater than 24 hours you will need to follow up with your provider, or enter a new e-Visit to address those concerns. You will  get an e-mail in the next two days asking about your experience.  I hope that your e-visit has been valuable and will speed your recovery. Thank you for using e-visits.   I have spent 5 minutes in review of e-visit questionnaire, review and updating patient chart, medical decision making and response to patient.   Delon CHRISTELLA Dickinson, PA-C

## 2024-09-20 ENCOUNTER — Encounter: Payer: Self-pay | Admitting: Urgent Care

## 2024-09-20 ENCOUNTER — Ambulatory Visit: Admitting: Urgent Care

## 2024-09-20 ENCOUNTER — Other Ambulatory Visit (HOSPITAL_COMMUNITY)
Admission: RE | Admit: 2024-09-20 | Discharge: 2024-09-20 | Disposition: A | Discharge status: Home / Self Care | Source: Ambulatory Visit | Attending: Urgent Care | Admitting: Urgent Care

## 2024-09-20 VITALS — BP 118/76 | HR 94 | Ht 61.5 in | Wt 196.8 lb

## 2024-09-20 DIAGNOSIS — Z6837 Body mass index (BMI) 37.0-37.9, adult: Secondary | ICD-10-CM

## 2024-09-20 DIAGNOSIS — Z124 Encounter for screening for malignant neoplasm of cervix: Secondary | ICD-10-CM

## 2024-09-20 DIAGNOSIS — E66812 Obesity, class 2: Secondary | ICD-10-CM

## 2024-09-20 DIAGNOSIS — R635 Abnormal weight gain: Secondary | ICD-10-CM

## 2024-09-20 MED ORDER — ZEPBOUND 2.5 MG/0.5ML ~~LOC~~ SOAJ: 2.5000 mg | SUBCUTANEOUS | 0 refills | Status: AC

## 2024-09-20 NOTE — Patient Instructions (Addendum)
 We completed your pap smear today.  I have also re-sent in the script for zepbound  to your pharmacy.  Return to see me in April for your annual physical with fasting labs

## 2024-09-20 NOTE — Progress Notes (Signed)
 "  Established Patient Office Visit  Subjective:  Patient ID: Tiffany Bass, female    DOB: [birth year]  Age: 39 y.o. MRN: [id number]  Chief Complaint  Patient presents with   Blood Pressure Check    Patient states BP was elevated at last visit- normal reading today in office   Weight Management Screening    Patient has not been able to pick up zepbound  as had not heard from insurance regarding coverage yet   Pap smear today     Yearly PAP     Tiffany Bass presents for her annual womens wellness visit. No acute complaints from a GU stand point.  BP has been monitored at home and pt reports it has been good. Controlled and stable in office today.  Pt did not ever get the zepbound  filled, nor did she get a response from prior auth team or pharmacy.   Patient Active Problem List   Diagnosis Date Noted   Gestational hypertension 01/13/2015   Past Medical History:  Diagnosis Date   Anxiety    Depression 2017   Medical history non-contributory    Pregnant 06/19/2014   Supervision of normal pregnancy in first trimester 06/29/2014    [facility]  Bryna Razavi 39 yo wm 1st Dating By US  Pap 10/06/12 GC/CT Initial:                36+wks: Genetic Screen NT/IT:  CF screen  Anatomic US   Flu vaccine  Tdap Recommended ~ 28wks Glucose Screen  2 hr GBS  Feed Preference  Contraception  Circumcision  Childbirth Classes  Pediatrician     Past Surgical History:  Procedure Laterality Date   CHOLECYSTECTOMY  2011   Social History[1]    ROS: as noted in HPI  Objective:     BP 118/76 (BP Location: Left Arm, Patient Position: Sitting, Cuff Size: Normal)   Pulse 94   Ht 5' 1.5 (1.562 m)   Wt 196 lb 12 oz (89.2 kg)   SpO2 98%   BMI 36.57 kg/m  BP Readings from Last 3 Encounters:  09/20/24 118/76  08/01/24 136/79  01/21/24 127/83   Wt Readings from Last 3 Encounters:  09/20/24 196 lb 12 oz (89.2 kg)  08/01/24 200 lb (90.7 kg)  01/21/24 188 lb 12.8 oz (85.6 kg)       Physical Exam Exam conducted with a chaperone present.  Constitutional:      General: She is not in acute distress.    Appearance: Normal appearance. She is not ill-appearing, toxic-appearing or diaphoretic.  Cardiovascular:     Rate and Rhythm: Normal rate.  Pulmonary:     Effort: Pulmonary effort is normal. No respiratory distress.  Chest:  Breasts:    Right: Normal. No swelling, bleeding, inverted nipple, mass, nipple discharge, skin change or tenderness.     Left: Normal. No swelling, bleeding, inverted nipple, mass, nipple discharge, skin change or tenderness.     Comments: Healing bruising noted to chest wall Genitourinary:    Pubic Area: No rash or pubic lice.      Labia:        Right: No rash, tenderness, lesion or injury.        Left: No rash, tenderness, lesion or injury.      Urethra: No prolapse, urethral pain, urethral swelling or urethral lesion.     Vagina: Normal. No vaginal discharge or lesions.     Cervix: Normal. No cervical motion tenderness, discharge, friability or lesion.  Uterus: Normal.      Adnexa: Right adnexa normal and left adnexa normal.       Right: No mass, tenderness or fullness.         Left: No mass, tenderness or fullness.       Rectum: Normal.        Comments: Extensive cervical mucous Lymphadenopathy:     Upper Body:     Right upper body: No supraclavicular, axillary or pectoral adenopathy.     Left upper body: No supraclavicular, axillary or pectoral adenopathy.     Lower Body: No right inguinal adenopathy. No left inguinal adenopathy.  Neurological:     Mental Status: She is alert.  Psychiatric:        Mood and Affect: Mood normal.        Behavior: Behavior normal.      No results found for any visits on 09/20/24.  Last CBC Lab Results  Component Value Date   WBC 6.1 01/08/2024   HGB 12.4 01/08/2024   HCT 37.9 01/08/2024   MCV 82.8 01/08/2024   MCH 27.1 01/08/2024   RDW 13.3 01/08/2024   PLT 216 01/08/2024   Last  metabolic panel Lab Results  Component Value Date   GLUCOSE 83 01/08/2024   NA 140 01/08/2024   K 4.0 01/08/2024   CL 105 01/08/2024   CO2 26 01/08/2024   BUN 9 01/08/2024   CREATININE 0.76 01/08/2024   GFRNONAA >60 01/08/2024   CALCIUM 9.6 01/08/2024   PROT 6.9 01/08/2024   ALBUMIN 4.6 01/08/2024   BILITOT 0.3 01/08/2024   ALKPHOS 82 01/08/2024   AST 20 01/08/2024   ALT 24 01/08/2024   ANIONGAP 10 01/08/2024   Last lipids Lab Results  Component Value Date   CHOL 163 12/30/2023   HDL 42.10 12/30/2023   LDLCALC 76 12/30/2023   TRIG 221.0 (H) 12/30/2023   CHOLHDL 4 12/30/2023   Last hemoglobin A1c Lab Results  Component Value Date   HGBA1C 5.4 12/30/2023   Last thyroid functions Lab Results  Component Value Date   TSH 2.23 12/30/2023   Last vitamin D No results found for: 25OHVITD2, 25OHVITD3, VD25OH Last vitamin B12 and Folate No results found for: VITAMINB12, FOLATE    The ASCVD Risk score (Arnett DK, et al., 2019) failed to calculate for the following reasons:   The 2019 ASCVD risk score is only valid for ages 29 to 70  Assessment & Plan:  Cervical cancer screening -     Cytology - PAP  Weight gain -     Zepbound ; Inject 2.5 mg into the skin once a week.  Dispense: 2 mL; Refill: 0  Class 2 obesity with body mass index (BMI) of 37.0 to 37.9 in adult, unspecified obesity type, unspecified whether serious comorbidity present -     Zepbound ; Inject 2.5 mg into the skin once a week.  Dispense: 2 mL; Refill: 0  Breast exam completed today. Pap smear also completed. Copious cervical mucous present however removed prior to pap. Benign nabothian cyst noted.  BP WNL today.  Will re-send in zepbound  as pt was never able to start the medication. Will complete PA if required.    Return in about 4 months (around 01/18/2025) for Annual Physical.   Benton LITTIE Gave, PA    [1]  Social History Tobacco Use   Smoking status: Never   Smokeless tobacco:  Never  Substance Use Topics   Alcohol use: No   Drug use: No   "

## 2024-09-22 ENCOUNTER — Ambulatory Visit: Payer: Self-pay | Admitting: Urgent Care

## 2024-09-22 DIAGNOSIS — R87612 Low grade squamous intraepithelial lesion on cytologic smear of cervix (LGSIL): Secondary | ICD-10-CM

## 2024-09-22 LAB — CYTOLOGY - PAP
Comment: NEGATIVE
High risk HPV: POSITIVE — AB

## 2024-09-26 ENCOUNTER — Telehealth: Admitting: Family Medicine

## 2024-09-26 DIAGNOSIS — J029 Acute pharyngitis, unspecified: Secondary | ICD-10-CM | POA: Diagnosis not present

## 2024-09-26 NOTE — Progress Notes (Signed)
 We are sorry that you are not feeling well.  Here is how we plan to help!  Your symptoms indicate a likely viral infection (Pharyngitis).   Pharyngitis is inflammation in the back of the throat which can cause a sore throat, scratchiness and sometimes difficulty swallowing.   Pharyngitis is typically caused by a respiratory virus and will just run its course.  Please keep in mind that your symptoms could last up to 10 days.   You can use nasal spray like Flonase  or Astelin to help with runny nose and congestion.  For throat pain, we recommend over the counter oral pain relief medications such as acetaminophen  or aspirin, or anti-inflammatory medications such as ibuprofen  or naproxen sodium.  Topical treatments such as oral throat lozenges or sprays may be used as needed. Salt water gargles are very helpful.   Avoid close contact with loved ones, especially the very young and elderly.  Remember to wash your hands thoroughly throughout the day as this is the number one way to prevent the spread of infection! We also recommend that you periodically wipe down door knobs and counters with disinfectant.  After careful review of your answers, I would not recommend and antibiotic for your condition.  Antibiotics should not be used to treat conditions that we suspect are caused by viruses like the virus that causes the common cold or flu.  Providers prescribe antibiotics to treat infections caused by bacteria. Antibiotics are very powerful in treating bacterial infections when they are used properly.  To maintain their effectiveness, they should be used only when necessary.  Overuse of antibiotics has resulted in the development of super bugs that are resistant to treatment!    Some people with strep throat, however, can have atypical symptoms. As such, if anything continued to progress despite treatment recommendations, you may need formal testing in clinic or office.  Home Care: Only take medications as  instructed by your medical team. Do not drink alcohol while taking these medications. A steam or ultrasonic humidifier can help congestion.  You can place a towel over your head and breathe in the steam from hot water coming from a faucet. Avoid close contacts especially the very young and the elderly. Cover your mouth when you cough or sneeze. Always remember to wash your hands.  Get Help Right Away If: You develop worsening fever or throat pain. You develop a severe head ache or visual changes. Your symptoms persist after you have completed your treatment plan.  Make sure you Understand these instructions. Will watch your condition. Will get help right away if you are not doing well or get worse.  Your e-visit answers were reviewed by a board certified advanced clinical practitioner to complete your personal care plan.  Depending on the condition, your plan could have included both over the counter or prescription medications.  If there is a problem please reply once you have received a response from your provider.  Your safety is important to us .  If you have drug allergies check your prescription carefully.    You can use MyChart to ask questions about todays visit, request a non-urgent call back, or ask for a work or school excuse for 24 hours related to this e-Visit. If it has been greater than 24 hours you will need to follow up with your provider, or enter a new e-Visit to address those concerns.  You will get an e-mail in the next two days asking about your experience.  I hope that  your e-visit has been valuable and will speed your recovery. Thank you for using e-visits.   I have spent 5 minutes in review of e-visit questionnaire, review and updating patient chart, medical decision making and response to patient.   Tiffany CHRISTELLA Barefoot, NP

## 2024-10-20 NOTE — Progress Notes (Unsigned)
" ° ° °  GYNECOLOGY OFFICE COLPOSCOPY PROCEDURE NOTE  39 y.o. 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   Pregnancy test:  {Blank single:19197::positive,negative,not indicated} Gardasil:  {Blank single:19197::completed,not yet had, declines,not yet had, accepts}. Discussed role for HPV in cervical dysplasia, need for surveillance.  Informed consent and review of risks, benefit and alternatives performed. Written consent given.   Speculum inserted into patient's vagina assuring full view of cervix and vaginal walls. 3 swabs of vinegar solution applied to the cervix and vaginal walls and colposcope was used to observe both the cervix and vaginal walls.   Colposcopy adequate? {yes/no:20286}  {Findings; colposcopy:728}; corresponding biopsies obtained.  ECC collected.  All specimens were labeled and sent to pathology.  Monsel's applied to biopsy sites for good hemostasis and speculum removed.  Pt tolerated well with minimal pain and bleeding.   Patient was given post procedure instructions.  Will follow up pathology and manage accordingly; patient will be contacted with results and recommendations.  Routine preventative health maintenance measures emphasized.  Kieth Carolin, MD Obstetrician & Gynecologist, [facility] for [facility], [facility]        "

## 2024-10-25 ENCOUNTER — Ambulatory Visit: Admitting: Obstetrics and Gynecology

## 2025-01-18 ENCOUNTER — Encounter: Admitting: Urgent Care
